# Patient Record
Sex: Female | Born: 1965 | Race: Black or African American | Hispanic: No | Marital: Single | Smoking: Former smoker
Health system: Southern US, Community
[De-identification: ages and names within clinical notes are randomized; demographics above are authoritative.]

## PROBLEM LIST (undated history)

## (undated) DIAGNOSIS — R519 Headache, unspecified: Secondary | ICD-10-CM

## (undated) DIAGNOSIS — F5104 Psychophysiologic insomnia: Secondary | ICD-10-CM

## (undated) DIAGNOSIS — J189 Pneumonia, unspecified organism: Secondary | ICD-10-CM

## (undated) DIAGNOSIS — K219 Gastro-esophageal reflux disease without esophagitis: Secondary | ICD-10-CM

## (undated) DIAGNOSIS — J45909 Unspecified asthma, uncomplicated: Secondary | ICD-10-CM

## (undated) DIAGNOSIS — G43019 Migraine without aura, intractable, without status migrainosus: Secondary | ICD-10-CM

## (undated) DIAGNOSIS — J383 Other diseases of vocal cords: Secondary | ICD-10-CM

## (undated) DIAGNOSIS — F419 Anxiety disorder, unspecified: Secondary | ICD-10-CM

## (undated) DIAGNOSIS — G4730 Sleep apnea, unspecified: Secondary | ICD-10-CM

## (undated) DIAGNOSIS — Z889 Allergy status to unspecified drugs, medicaments and biological substances status: Secondary | ICD-10-CM

## (undated) DIAGNOSIS — F32A Depression, unspecified: Secondary | ICD-10-CM

## (undated) DIAGNOSIS — I10 Essential (primary) hypertension: Secondary | ICD-10-CM

## (undated) DIAGNOSIS — C801 Malignant (primary) neoplasm, unspecified: Secondary | ICD-10-CM

## (undated) DIAGNOSIS — F329 Major depressive disorder, single episode, unspecified: Secondary | ICD-10-CM

## (undated) DIAGNOSIS — R51 Headache: Secondary | ICD-10-CM

## (undated) DIAGNOSIS — Z9189 Other specified personal risk factors, not elsewhere classified: Secondary | ICD-10-CM

## (undated) HISTORY — PX: PARTIAL HYSTERECTOMY: SHX80

## (undated) HISTORY — DX: Migraine without aura, intractable, without status migrainosus: G43.019

## (undated) HISTORY — PX: OTHER SURGICAL HISTORY: SHX169

## (undated) HISTORY — PX: UTERINE FIBROID SURGERY: SHX826

## (undated) HISTORY — DX: Psychophysiologic insomnia: F51.04

---

## 2015-04-24 ENCOUNTER — Emergency Department (HOSPITAL_COMMUNITY): Payer: Managed Care, Other (non HMO)

## 2015-04-24 ENCOUNTER — Encounter (HOSPITAL_COMMUNITY): Payer: Self-pay | Admitting: Emergency Medicine

## 2015-04-24 ENCOUNTER — Emergency Department (HOSPITAL_COMMUNITY)
Admission: EM | Admit: 2015-04-24 | Discharge: 2015-04-24 | Discharge status: Against Medical Advice | Payer: Managed Care, Other (non HMO) | Attending: Emergency Medicine | Admitting: Emergency Medicine

## 2015-04-24 DIAGNOSIS — I10 Essential (primary) hypertension: Secondary | ICD-10-CM | POA: Insufficient documentation

## 2015-04-24 DIAGNOSIS — R062 Wheezing: Secondary | ICD-10-CM

## 2015-04-24 DIAGNOSIS — Z87891 Personal history of nicotine dependence: Secondary | ICD-10-CM | POA: Insufficient documentation

## 2015-04-24 DIAGNOSIS — Z79899 Other long term (current) drug therapy: Secondary | ICD-10-CM | POA: Insufficient documentation

## 2015-04-24 DIAGNOSIS — J45901 Unspecified asthma with (acute) exacerbation: Secondary | ICD-10-CM | POA: Diagnosis not present

## 2015-04-24 DIAGNOSIS — Z8669 Personal history of other diseases of the nervous system and sense organs: Secondary | ICD-10-CM | POA: Insufficient documentation

## 2015-04-24 DIAGNOSIS — Z791 Long term (current) use of non-steroidal anti-inflammatories (NSAID): Secondary | ICD-10-CM | POA: Diagnosis not present

## 2015-04-24 DIAGNOSIS — R079 Chest pain, unspecified: Secondary | ICD-10-CM | POA: Diagnosis present

## 2015-04-24 HISTORY — DX: Sleep apnea, unspecified: G47.30

## 2015-04-24 HISTORY — DX: Unspecified asthma, uncomplicated: J45.909

## 2015-04-24 HISTORY — DX: Essential (primary) hypertension: I10

## 2015-04-24 MED ORDER — IPRATROPIUM-ALBUTEROL 0.5-2.5 (3) MG/3ML IN SOLN
3.0000 mL | Freq: Once | RESPIRATORY_TRACT | Status: AC
  Administered 2015-04-24: 3 mL via RESPIRATORY_TRACT
  Filled 2015-04-24: qty 3

## 2015-04-24 NOTE — ED Notes (Signed)
Pt has agreed to a breathing treatment, but is refusing all other treatment including labs and xrays.

## 2015-04-24 NOTE — ED Provider Notes (Signed)
CSN: [id number]     Arrival date & time 04/24/15  1435 History   First MD Initiated Contact with Patient 04/24/15 1510     Chief Complaint  Patient presents with  . Chest Pain  . Shortness of Breath     (Consider location/radiation/quality/duration/timing/severity/associated sxs/prior Treatment) Patient is a 49 y.o. female presenting with chest pain and shortness of breath.  Chest Pain Pain location:  L chest Pain quality: sharp   Pain radiates to:  Does not radiate Pain radiates to the back: no   Pain severity:  Severe Onset quality:  Gradual Duration:  2 weeks Timing:  Intermittent Progression:  Waxing and waning Chronicity:  Recurrent Context: breathing   Relieved by: albuterol. Worsened by:  Nothing tried Ineffective treatments:  None tried Associated symptoms: cough and shortness of breath   Associated symptoms: no abdominal pain, no diaphoresis, no fever, no headache, no nausea, no near-syncope and not vomiting   Risk factors: high cholesterol   Risk factors: no prior DVT/PE and no smoking   Shortness of Breath Associated symptoms: chest pain, cough and wheezing   Associated symptoms: no abdominal pain, no diaphoresis, no fever, no headaches, no rash, no sore throat and no vomiting     Past Medical History  Diagnosis Date  . Asthma   . Hypertension   . Sleep apnea    History reviewed. No pertinent past surgical history. History reviewed. No pertinent family history. Social History  Substance Use Topics  . Smoking status: Former Smoker -- 0.50 packs/day    Types: Cigarettes    Quit date: 08/24/1994  . Smokeless tobacco: None  . Alcohol Use: Yes     Comment: occasionally   OB History    No data available     Review of Systems  Constitutional: Negative for fever, chills and diaphoresis.  HENT: Negative for congestion and sore throat.   Eyes: Negative for visual disturbance.  Respiratory: Positive for cough, shortness of breath and wheezing.    Cardiovascular: Positive for chest pain. Negative for leg swelling and near-syncope.  Gastrointestinal: Negative for nausea, vomiting, abdominal pain, diarrhea and constipation.  Genitourinary: Negative for dysuria, difficulty urinating and vaginal pain.  Musculoskeletal: Negative for myalgias and arthralgias.  Skin: Negative for rash.  Neurological: Negative for syncope and headaches.  Psychiatric/Behavioral: Negative for behavioral problems.  All other systems reviewed and are negative.     Allergies  Prednisone  Home Medications   Prior to Admission medications   Medication Sig Start Date End Date Taking? Authorizing Provider  albuterol (PROAIR HFA) 108 (90 BASE) MCG/ACT inhaler Inhale 2 puffs into the lungs every 6 (six) hours as needed for wheezing or shortness of breath.   Yes Historical Provider, MD  albuterol (PROVENTIL) (2.5 MG/3ML) 0.083% nebulizer solution Take 2.5 mg by nebulization every 6 (six) hours as needed for wheezing or shortness of breath.   Yes Historical Provider, MD  ALPRAZolam (XANAX) 0.25 MG tablet Take 0.25 mg by mouth at bedtime.   Yes Historical Provider, MD  eszopiclone (LUNESTA) 1 MG TABS tablet Take 1 mg by mouth at bedtime. Take immediately before bedtime   Yes Historical Provider, MD  Fluticasone-Salmeterol (ADVAIR) 500-50 MCG/DOSE AEPB Inhale 1 puff into the lungs 2 (two) times daily.   Yes Historical Provider, MD  meloxicam (MOBIC) 15 MG tablet Take 15 mg by mouth daily.   Yes Historical Provider, MD  Omega-3 Fatty Acids (OMEGA 3 PO) Take 1 tablet by mouth daily.   Yes Historical Provider, MD  pantoprazole (PROTONIX) 40 MG tablet Take 40 mg by mouth daily.   Yes Historical Provider, MD  valsartan-hydrochlorothiazide (DIOVAN-HCT) 160-12.5 MG per tablet Take 1 tablet by mouth daily.   Yes Historical Provider, MD   BP 125/65 mmHg  Pulse 90  Temp(Src) 98.2 F (36.8 C) (Oral)  Resp 20  SpO2 95% Physical Exam  Constitutional: She is oriented to  person, place, and time. She appears well-developed and well-nourished. No distress.  HENT:  Head: Normocephalic and atraumatic.  Eyes: EOM are normal.  Neck: Normal range of motion.  Cardiovascular: Normal rate, regular rhythm and normal heart sounds.   No murmur heard. Pulmonary/Chest: Effort normal. No respiratory distress. She has wheezes. She has no rales. She exhibits no tenderness.  Abdominal: Soft. There is no tenderness.  Musculoskeletal: She exhibits no edema.  Neurological: She is alert and oriented to person, place, and time.  Skin: She is not diaphoretic.  Psychiatric: She has a normal mood and affect. Her behavior is normal.    ED Course  Procedures (including critical care time) Labs Review Labs Reviewed  [place], ED    Imaging Review Dg Chest Port 1 View  04/24/2015   CLINICAL DATA:  Asthma, hypertension, sleep apnea.  EXAM: PORTABLE CHEST - 1 VIEW  COMPARISON:  None.  FINDINGS: The heart size and mediastinal contours are within normal limits. Both lungs are clear. The visualized skeletal structures are unremarkable.  IMPRESSION: No active disease.   Electronically Signed   By: Nolon Nations M.D.   On: 04/24/2015 16:25   I have personally reviewed and evaluated these images and lab results as part of my medical decision-making.   EKG Interpretation   Date/Time:  Wednesday April 24 2015 14:39:56 EDT Ventricular Rate:  96 PR Interval:  164 QRS Duration: 86 QT Interval:  428 QTC Calculation: 541 R Axis:   -17 Text Interpretation:  Sinus rhythm Borderline left axis deviation  Borderline T abnormalities, anterior leads Prolonged QT interval No  previous ECGs available Confirmed by YAO  MD, DAVID ([id number]) on 04/24/2015  3:34:28 PM      MDM   Final diagnoses:  Chest pain, unspecified chest pain type  Wheezing     Is a 49 year old female with a history of asthma, hypertension, OSA, anxiety, laryngeal dysfunction that  presents with 2 weeks of intermittent chest pain and shortness of breath. Patient states this feels like her asthma however did not have her breathing treatments with her. Patient went to an urgent care prior to arrival and received an albuterol nebulized treatment and feels much better at this time. Upon arrival to the ED the patient is refusing all treatments with the exception of breathing treatment. Patient's EKG shows normal sinus rhythm with nonspecific T-wave abnormalities. Patient states no one has chest pain is short of breath and is wheezing. On exam patient is significant referred upper airway sounds however there is faint wheezing bilaterally. We will give her a DuoNeb treatment. Patient refuses chest x-ray blood work or steroids. The patient denies any fever or productive cough.  Patient feels initially better after breathing treatment chest x-ray was unremarkable. Again was discussed with the patient about risk for with bleeding prior to full cardiac workup and patient declined. Patient left AMA with return precautions.  Renne Musca, MD 04/24/15 [place] Yao, MD 04/24/15 [phone]

## 2015-04-24 NOTE — ED Notes (Signed)
Pt refusing to have IV/labs to be drawn, states she is terrified of needles. Explanation given as to why it's important for her to have this done because of her having chest pain. Pt asked for this RN to step out of room to have conversation with husband regarding this.

## 2015-04-24 NOTE — ED Notes (Signed)
Pt refusing lab draw at this time 

## 2015-04-24 NOTE — ED Notes (Signed)
MD at bedside. 

## 2015-04-24 NOTE — ED Notes (Signed)
Pt asking to leave AMA. Pt agrees to wait for MD to see pt and assess her before leaving.

## 2015-04-24 NOTE — ED Notes (Signed)
Attempted to get blood from pt pt refused RN aware

## 2015-04-24 NOTE — ED Notes (Signed)
Pt to ED via [facility] - pt has been having left sided lower chest pain x2 weeks, described as sharp; no radiation. Given 324 aspirin in route by EMS. Pt has hx of dysfunctional vocal chords. Pt is sob on arrival to ED. 118/74 BP, 177 CBG pt not diabetic, NSR on 12 lead, 100% on RA.

## 2015-04-24 NOTE — ED Notes (Signed)
Pt ambulatory at discharge with steady gait. A/O x4. Reports relief after breathing treatment. NAD on departure. VSS

## 2015-04-24 NOTE — Discharge Instructions (Signed)

## 2015-04-24 NOTE — ED Notes (Signed)
Pt reports significant relief with breathing treatment.

## 2015-04-30 ENCOUNTER — Emergency Department (HOSPITAL_COMMUNITY): Payer: Managed Care, Other (non HMO)

## 2015-04-30 ENCOUNTER — Inpatient Hospital Stay (HOSPITAL_COMMUNITY)
Admission: EM | Admit: 2015-04-30 | Discharge: 2015-05-08 | DRG: 193 | Disposition: A | Discharge status: Home Health | Payer: Managed Care, Other (non HMO) | Attending: Internal Medicine | Admitting: Internal Medicine

## 2015-04-30 ENCOUNTER — Encounter (HOSPITAL_COMMUNITY): Payer: Self-pay | Admitting: Emergency Medicine

## 2015-04-30 DIAGNOSIS — F419 Anxiety disorder, unspecified: Secondary | ICD-10-CM | POA: Diagnosis present

## 2015-04-30 DIAGNOSIS — R7309 Other abnormal glucose: Secondary | ICD-10-CM | POA: Diagnosis present

## 2015-04-30 DIAGNOSIS — K219 Gastro-esophageal reflux disease without esophagitis: Secondary | ICD-10-CM | POA: Diagnosis present

## 2015-04-30 DIAGNOSIS — J9600 Acute respiratory failure, unspecified whether with hypoxia or hypercapnia: Secondary | ICD-10-CM | POA: Diagnosis not present

## 2015-04-30 DIAGNOSIS — Z87891 Personal history of nicotine dependence: Secondary | ICD-10-CM

## 2015-04-30 DIAGNOSIS — Z79899 Other long term (current) drug therapy: Secondary | ICD-10-CM | POA: Diagnosis not present

## 2015-04-30 DIAGNOSIS — R140 Abdominal distension (gaseous): Secondary | ICD-10-CM

## 2015-04-30 DIAGNOSIS — R739 Hyperglycemia, unspecified: Secondary | ICD-10-CM | POA: Diagnosis present

## 2015-04-30 DIAGNOSIS — J45901 Unspecified asthma with (acute) exacerbation: Secondary | ICD-10-CM

## 2015-04-30 DIAGNOSIS — K5900 Constipation, unspecified: Secondary | ICD-10-CM | POA: Diagnosis present

## 2015-04-30 DIAGNOSIS — R7881 Bacteremia: Secondary | ICD-10-CM | POA: Diagnosis present

## 2015-04-30 DIAGNOSIS — Z888 Allergy status to other drugs, medicaments and biological substances status: Secondary | ICD-10-CM

## 2015-04-30 DIAGNOSIS — J069 Acute upper respiratory infection, unspecified: Secondary | ICD-10-CM | POA: Diagnosis present

## 2015-04-30 DIAGNOSIS — R0602 Shortness of breath: Secondary | ICD-10-CM | POA: Diagnosis not present

## 2015-04-30 DIAGNOSIS — J9601 Acute respiratory failure with hypoxia: Secondary | ICD-10-CM | POA: Diagnosis not present

## 2015-04-30 DIAGNOSIS — J4521 Mild intermittent asthma with (acute) exacerbation: Secondary | ICD-10-CM

## 2015-04-30 DIAGNOSIS — R7303 Prediabetes: Secondary | ICD-10-CM | POA: Diagnosis present

## 2015-04-30 DIAGNOSIS — J189 Pneumonia, unspecified organism: Principal | ICD-10-CM | POA: Diagnosis present

## 2015-04-30 DIAGNOSIS — Z791 Long term (current) use of non-steroidal anti-inflammatories (NSAID): Secondary | ICD-10-CM

## 2015-04-30 DIAGNOSIS — G4733 Obstructive sleep apnea (adult) (pediatric): Secondary | ICD-10-CM | POA: Diagnosis present

## 2015-04-30 DIAGNOSIS — Z8249 Family history of ischemic heart disease and other diseases of the circulatory system: Secondary | ICD-10-CM | POA: Diagnosis not present

## 2015-04-30 DIAGNOSIS — J383 Other diseases of vocal cords: Secondary | ICD-10-CM | POA: Diagnosis present

## 2015-04-30 DIAGNOSIS — R079 Chest pain, unspecified: Secondary | ICD-10-CM | POA: Diagnosis present

## 2015-04-30 DIAGNOSIS — B957 Other staphylococcus as the cause of diseases classified elsewhere: Secondary | ICD-10-CM | POA: Diagnosis present

## 2015-04-30 DIAGNOSIS — I10 Essential (primary) hypertension: Secondary | ICD-10-CM | POA: Diagnosis present

## 2015-04-30 DIAGNOSIS — R42 Dizziness and giddiness: Secondary | ICD-10-CM | POA: Diagnosis not present

## 2015-04-30 DIAGNOSIS — Z801 Family history of malignant neoplasm of trachea, bronchus and lung: Secondary | ICD-10-CM | POA: Diagnosis not present

## 2015-04-30 DIAGNOSIS — G4730 Sleep apnea, unspecified: Secondary | ICD-10-CM | POA: Diagnosis present

## 2015-04-30 HISTORY — DX: Anxiety disorder, unspecified: F41.9

## 2015-04-30 HISTORY — DX: Other diseases of vocal cords: J38.3

## 2015-04-30 LAB — CBC WITH DIFFERENTIAL/PLATELET
Basophils Absolute: 0 10*3/uL (ref 0.0–0.1)
Basophils Relative: 0 % (ref 0–1)
EOS ABS: 0 10*3/uL (ref 0.0–0.7)
Eosinophils Relative: 0 % (ref 0–5)
HCT: 36.4 % (ref 36.0–46.0)
HEMOGLOBIN: 12 g/dL (ref 12.0–15.0)
LYMPHS ABS: 0.8 10*3/uL (ref 0.7–4.0)
LYMPHS PCT: 8 % — AB (ref 12–46)
MCH: 28.3 pg (ref 26.0–34.0)
MCHC: 33 g/dL (ref 30.0–36.0)
MCV: 85.8 fL (ref 78.0–100.0)
Monocytes Absolute: 0.1 10*3/uL (ref 0.1–1.0)
Monocytes Relative: 1 % — ABNORMAL LOW (ref 3–12)
NEUTROS ABS: 8.4 10*3/uL — AB (ref 1.7–7.7)
NEUTROS PCT: 91 % — AB (ref 43–77)
Platelets: 251 10*3/uL (ref 150–400)
RBC: 4.24 MIL/uL (ref 3.87–5.11)
RDW: 15.7 % — ABNORMAL HIGH (ref 11.5–15.5)
WBC: 9.4 10*3/uL (ref 4.0–10.5)

## 2015-04-30 LAB — BASIC METABOLIC PANEL
Anion gap: 12 (ref 5–15)
BUN: 13 mg/dL (ref 6–20)
CHLORIDE: 104 mmol/L (ref 101–111)
CO2: 22 mmol/L (ref 22–32)
Calcium: 9.6 mg/dL (ref 8.9–10.3)
Creatinine, Ser: 0.91 mg/dL (ref 0.44–1.00)
GFR calc Af Amer: >60 mL/min (ref >60)
GFR calc non Af Amer: >60 mL/min (ref >60)
Glucose, Bld: 249 mg/dL — ABNORMAL HIGH (ref 65–99)
POTASSIUM: 3.7 mmol/L (ref 3.5–5.1)
SODIUM: 138 mmol/L (ref 135–145)

## 2015-04-30 MED ORDER — IPRATROPIUM-ALBUTEROL 0.5-2.5 (3) MG/3ML IN SOLN
3.0000 mL | RESPIRATORY_TRACT | Status: AC
  Administered 2015-04-30 (×3): 3 mL via RESPIRATORY_TRACT
  Filled 2015-04-30: qty 3
  Filled 2015-04-30: qty 9

## 2015-04-30 MED ORDER — MAGNESIUM SULFATE 2 GM/50ML IV SOLN
2.0000 g | Freq: Once | INTRAVENOUS | Status: AC
  Administered 2015-04-30: 2 g via INTRAVENOUS
  Filled 2015-04-30: qty 50

## 2015-04-30 MED ORDER — DEXTROSE 5 % IV SOLN
1.0000 g | Freq: Once | INTRAVENOUS | Status: AC
  Administered 2015-04-30: 1 g via INTRAVENOUS
  Filled 2015-04-30: qty 10

## 2015-04-30 MED ORDER — ALBUTEROL (5 MG/ML) CONTINUOUS INHALATION SOLN
10.0000 mg/h | INHALATION_SOLUTION | RESPIRATORY_TRACT | Status: DC
  Administered 2015-04-30: 10 mg/h via RESPIRATORY_TRACT
  Filled 2015-04-30: qty 20

## 2015-04-30 MED ORDER — SODIUM CHLORIDE 0.9 % IV BOLUS (SEPSIS)
1000.0000 mL | Freq: Once | INTRAVENOUS | Status: AC
  Administered 2015-04-30: 1000 mL via INTRAVENOUS

## 2015-04-30 MED ORDER — DEXTROSE 5 % IV SOLN
500.0000 mg | Freq: Once | INTRAVENOUS | Status: AC
  Administered 2015-04-30: 500 mg via INTRAVENOUS
  Filled 2015-04-30: qty 500

## 2015-04-30 NOTE — H&P (Signed)
[facility] History and Physical  Allison Guerra YCX:[id number] DOB: [birth year] DOA: 04/30/2015  Referring physician: ED physician PCP: No primary care provider on file.  Specialists:   Chief Complaint: Shortness of breath, productive cough  HPI: Allison Guerra is a 49 y.o. female with PMH of hypertension, asthma, GERD, anxiety, OSA, vocal cord dysfunction, who presents with the shortness of breath and productive cough.  Patient reports that she started having worsening shortness of breath at about 3 PM today. She coughs up yellow colored sputum in the past several days. She had one episode of mild chest pain, which has resolved. Currently not any chest pain. She has subjective fever, but no chills. She has nausea, but no abdominal pain, diarrhea. She is constipated. She reports that she had 2 hour traveling by car to [facility]. No tenderness over calf areas.   In ED, patient was found to have WBC 9.4, blood sugar 249 on BMP, temperature normal, tachycardia, electrolytes okay. Chest x-ray showed minimal opacity over both costophrenic angles which appears most consistent with atelectasis, but pneumonia particularly on the left difficult to exclude.  Where does patient live?   At home    Can patient participate in ADLs?  Yes   Review of Systems:   General: has subjective fevers, no chills, no changes in body weight, has fatigue HEENT: no blurry vision, hearing changes or sore throat Pulm: has dyspnea, coughing, wheezing CV: no chest pain, palpitations Abd: has nausea, no vomiting, abdominal pain, diarrhea, has constipation GU: no dysuria, burning on urination, increased urinary frequency, hematuria  Ext: no leg edema Neuro: no unilateral weakness, numbness, or tingling, no vision change or hearing loss Skin: no rash MSK: No muscle spasm, no deformity, no limitation of range of movement in spin Heme: No easy bruising.  Travel history: No recent long distant  travel.  Allergy:  Allergies  Allergen Reactions  . Reglan [Metoclopramide] Other (See Comments)    Made her aggressive, made her feel crazy  . Prednisone Anxiety    Jittery    Past Medical History  Diagnosis Date  . Asthma   . Hypertension   . Sleep apnea   . Vocal cord dysfunction   . Anxiety     Past Surgical History  Procedure Laterality Date  . Uterine fibroid surgery    . Partial hysterectomy      Social History:  reports that she quit smoking about 20 years ago. Her smoking use included Cigarettes. She smoked 0.50 packs per day. She does not have any smokeless tobacco history on file. She reports that she drinks alcohol. Her drug history is not on file.  Family History:  Family History  Problem Relation Age of Onset  . Cancer - Lung Mother   . Hypertension Father   . Hypertension Sister      Prior to Admission medications   Medication Sig Start Date End Date Taking? Authorizing Provider  albuterol (PROAIR HFA) 108 (90 BASE) MCG/ACT inhaler Inhale 2 puffs into the lungs every 6 (six) hours as needed for wheezing or shortness of breath.   Yes Historical Provider, MD  albuterol (PROVENTIL) (2.5 MG/3ML) 0.083% nebulizer solution Take 2.5 mg by nebulization every 6 (six) hours as needed for wheezing or shortness of breath.   Yes Historical Provider, MD  ALPRAZolam (XANAX) 0.25 MG tablet Take 0.25 mg by mouth 3 (three) times daily as needed for anxiety or sleep.    Yes Historical Provider, MD  eszopiclone (LUNESTA) 1 MG TABS tablet Take  1 mg by mouth at bedtime. Take immediately before bedtime   Yes Historical Provider, MD  fexofenadine (ALLEGRA) 180 MG tablet Take 180 mg by mouth daily as needed for allergies or rhinitis.   Yes Historical Provider, MD  Fluticasone-Salmeterol (ADVAIR) 500-50 MCG/DOSE AEPB Inhale 1 puff into the lungs 2 (two) times daily.   Yes Historical Provider, MD  ibuprofen (ADVIL,MOTRIN) 600 MG tablet Take 600 mg by mouth daily as needed for moderate  pain.   Yes Historical Provider, MD  meloxicam (MOBIC) 15 MG tablet Take 15 mg by mouth daily as needed.    Yes Historical Provider, MD  Omega-3 Fatty Acids (OMEGA 3 PO) Take 1 tablet by mouth daily.   Yes Historical Provider, MD  pantoprazole (PROTONIX) 40 MG tablet Take 40 mg by mouth daily.   Yes Historical Provider, MD  valsartan-hydrochlorothiazide (DIOVAN-HCT) 160-12.5 MG per tablet Take 1 tablet by mouth daily.   Yes Historical Provider, MD    Physical Exam: Filed Vitals:   04/30/15 1802 04/30/15 1817 04/30/15 1909 04/30/15 2057  BP:   129/65   Pulse:   103   Temp:      TempSrc:      Resp:   22   SpO2: 100% 100% 97% 98%   General: Not in acute distress HEENT:       Eyes: PERRL, EOMI, no scleral icterus.       ENT: No discharge from the ears and nose, no pharynx injection, no tonsillar enlargement.        Neck: No JVD, no bruit, no mass felt. Heme: No neck lymph node enlargement. Cardiac: S1/S2, RRR, No murmurs, No gallops or rubs. Pulm: severely decreased air movement bilaterally. Has wheezing. No rales,  rhonchi or rubs. Abd: Soft, nondistended, nontender, no rebound pain, no organomegaly, BS present. Ext: No pitting leg edema bilaterally. 2+DP/PT pulse bilaterally. Musculoskeletal: No joint deformities, No joint redness or warmth, no limitation of ROM in spin. Skin: No rashes.  Neuro: Alert, oriented X3, cranial nerves II-XII grossly intact, muscle strength 5/5 in all extremities, sensation to light touch intact.  Psych: Patient is not psychotic, no suicidal or hemocidal ideation.  Labs on Admission:  Basic Metabolic Panel:  Recent Labs Lab 04/30/15 2101  NA 138  K 3.7  CL 104  CO2 22  GLUCOSE 249*  BUN 13  CREATININE 0.91  CALCIUM 9.6   Liver Function Tests: No results for input(s): AST, ALT, ALKPHOS, BILITOT, PROT, ALBUMIN in the last 168 hours. No results for input(s): LIPASE, AMYLASE in the last 168 hours. No results for input(s): AMMONIA in the last 168  hours. CBC:  Recent Labs Lab 04/30/15 2101  WBC 9.4  NEUTROABS 8.4*  HGB 12.0  HCT 36.4  MCV 85.8  PLT 251   Cardiac Enzymes: No results for input(s): CKTOTAL, CKMB, CKMBINDEX, TROPONINI in the last 168 hours.  BNP (last 3 results) No results for input(s): BNP in the last 8760 hours.  ProBNP (last 3 results) No results for input(s): PROBNP in the last 8760 hours.  CBG: No results for input(s): GLUCAP in the last 168 hours.  Radiological Exams on Admission: Dg Chest Port 1 View  04/30/2015   CLINICAL DATA:  Productive cough, pain under both breasts since this morning with shortness of breath  EXAM: PORTABLE CHEST - 1 VIEW  COMPARISON:  None.  FINDINGS: Heart size and vascular pattern normal. Minimal density over both costophrenic angles. Lungs otherwise clear.  IMPRESSION: Minimal opacity over both costophrenic angles  which appears most consistent with atelectasis. Developing pneumonia particularly on the left difficult to exclude however.   Electronically Signed   By: Skipper Cliche M.D.   On: 04/30/2015 18:38    EKG: Not done in ED, will get one.   Assessment/Plan Principal Problem:   Acute respiratory failure Active Problems:   CAP (community acquired pneumonia)   Asthma exacerbation   Hypertension   Sleep apnea   SOB (shortness of breath)   Cough   Constipation   Elevated blood sugar   Anxiety  Acute respiratory failure: Most likely due to asthma exacerbation given decreased air movement and wheezing on lung auscultation, but cannot rule out CAP completely. Patient is not septic on admission.  -will admit patient to telemetry bed  -Nebulizers: scheduled Duoneb and prn albuterol -Solu-Medrol 60 mg IV tid -rocephin and azithromycin -Mucinex for cough  -Urine drug screen, HIV -Urine legionella and S. pneumococcal antigen -Follow up blood culture x2, sputum culture and Flu pcr  Hypertension: -continue  Diovan-HCTZ  OSA: -CPAP  GERD: -Protonix  Constipation: -Colace  Elevated blood sugar level: Blood sugar 249 on admission. Patient does not have history of diabetes. Expecting further increase of CBG due to starting Solu-Medrol. -check A1c -SSI  Anxiety: Stable, no suicidal or homicidal ideations. -Continue home medications: When necessary Xanax   DVT ppx: SQ Heparin    Code Status: Full code Family Communication: None at bed side.   Disposition Plan: Admit to inpatient   Date of Service 04/30/2015    Ivor Costa [facility] Pager [phone]  If 7PM-7AM, please contact night-coverage [email or web address] Password [email or web address] 04/30/2015, 11:38 PM

## 2015-04-30 NOTE — ED Provider Notes (Signed)
CSN: [id number]     Arrival date & time 04/30/15  1644 History   First MD Initiated Contact with Patient 04/30/15 1649     Chief Complaint  Patient presents with  . Shortness of Breath     (Consider location/radiation/quality/duration/timing/severity/associated sxs/prior Treatment) Patient is a 49 y.o. female presenting with shortness of breath. The history is provided by the patient.  Shortness of Breath Severity:  Moderate Onset quality:  Sudden Duration:  1 day Timing:  Constant Progression:  Worsening Chronicity:  Recurrent Context: URI   Relieved by:  Nothing Worsened by:  Nothing tried Ineffective treatments:  None tried Associated symptoms: sputum production and wheezing   Associated symptoms: no chest pain, no fever, no headaches and no vomiting    49 yo F with a chief complaint shortness breath. Patient has a history of asthma this feels the same. States she has had some cough congestion subjective fevers and chills with sputum production today. Was seen about a week ago for similar presentation. Patient feels like it has recurred. Say she's been compliant with her home medications.  Past Medical History  Diagnosis Date  . Asthma   . Hypertension   . Sleep apnea   . Vocal cord dysfunction   . Anxiety    Past Surgical History  Procedure Laterality Date  . Uterine fibroid surgery    . Partial hysterectomy     Family History  Problem Relation Age of Onset  . Cancer - Lung Mother   . Hypertension Father   . Hypertension Sister    Social History  Substance Use Topics  . Smoking status: Former Smoker -- 0.50 packs/day    Types: Cigarettes    Quit date: 08/24/1994  . Smokeless tobacco: None  . Alcohol Use: Yes     Comment: occasionally   OB History    No data available     Review of Systems  Constitutional: Negative for fever and chills.  HENT: Negative for congestion and rhinorrhea.   Eyes: Negative for redness and visual disturbance.  Respiratory:  Positive for sputum production, shortness of breath and wheezing.   Cardiovascular: Negative for chest pain and palpitations.  Gastrointestinal: Negative for nausea and vomiting.  Genitourinary: Negative for dysuria and urgency.  Musculoskeletal: Negative for myalgias and arthralgias.  Skin: Negative for pallor and wound.  Neurological: Negative for dizziness and headaches.      Allergies  Reglan and Prednisone  Home Medications   Prior to Admission medications   Medication Sig Start Date End Date Taking? Authorizing Provider  albuterol (PROAIR HFA) 108 (90 BASE) MCG/ACT inhaler Inhale 2 puffs into the lungs every 6 (six) hours as needed for wheezing or shortness of breath.   Yes Historical Provider, MD  albuterol (PROVENTIL) (2.5 MG/3ML) 0.083% nebulizer solution Take 2.5 mg by nebulization every 6 (six) hours as needed for wheezing or shortness of breath.   Yes Historical Provider, MD  ALPRAZolam (XANAX) 0.25 MG tablet Take 0.25 mg by mouth 3 (three) times daily as needed for anxiety or sleep.    Yes Historical Provider, MD  eszopiclone (LUNESTA) 1 MG TABS tablet Take 1 mg by mouth at bedtime. Take immediately before bedtime   Yes Historical Provider, MD  fexofenadine (ALLEGRA) 180 MG tablet Take 180 mg by mouth daily as needed for allergies or rhinitis.   Yes Historical Provider, MD  Fluticasone-Salmeterol (ADVAIR) 500-50 MCG/DOSE AEPB Inhale 1 puff into the lungs 2 (two) times daily.   Yes Historical Provider, MD  ibuprofen (  ADVIL,MOTRIN) 600 MG tablet Take 600 mg by mouth daily as needed for moderate pain.   Yes Historical Provider, MD  meloxicam (MOBIC) 15 MG tablet Take 15 mg by mouth daily as needed.    Yes Historical Provider, MD  Omega-3 Fatty Acids (OMEGA 3 PO) Take 1 tablet by mouth daily.   Yes Historical Provider, MD  pantoprazole (PROTONIX) 40 MG tablet Take 40 mg by mouth daily.   Yes Historical Provider, MD  valsartan-hydrochlorothiazide (DIOVAN-HCT) 160-12.5 MG per  tablet Take 1 tablet by mouth daily.   Yes Historical Provider, MD   BP 129/65 mmHg  Pulse 103  Temp(Src) 97.4 F (36.3 C) (Oral)  Resp 22  SpO2 98% Physical Exam  Constitutional: She is oriented to person, place, and time. She appears well-developed and well-nourished. No distress.  HENT:  Head: Normocephalic and atraumatic.  Eyes: EOM are normal. Pupils are equal, round, and reactive to light.  Neck: Normal range of motion. Neck supple.  Cardiovascular: Normal rate and regular rhythm.  Exam reveals no gallop and no friction rub.   No murmur heard. Pulmonary/Chest: Effort normal. She has wheezes (diffuse prolonged expiration). She has no rales.  Abdominal: Soft. She exhibits no distension. There is no tenderness.  Musculoskeletal: She exhibits no edema or tenderness.  Neurological: She is alert and oriented to person, place, and time.  Skin: Skin is warm and dry. She is not diaphoretic.  Psychiatric: She has a normal mood and affect. Her behavior is normal.    ED Course  Procedures (including critical care time) Labs Review Labs Reviewed  CBC WITH DIFFERENTIAL/PLATELET - Abnormal; Notable for the following:    RDW 15.7 (*)    Neutrophils Relative % 91 (*)    Neutro Abs 8.4 (*)    Lymphocytes Relative 8 (*)    Monocytes Relative 1 (*)    All other components within normal limits  BASIC METABOLIC PANEL - Abnormal; Notable for the following:    Glucose, Bld 249 (*)    All other components within normal limits    Imaging Review Dg Chest Port 1 View  04/30/2015   CLINICAL DATA:  Productive cough, pain under both breasts since this morning with shortness of breath  EXAM: PORTABLE CHEST - 1 VIEW  COMPARISON:  None.  FINDINGS: Heart size and vascular pattern normal. Minimal density over both costophrenic angles. Lungs otherwise clear.  IMPRESSION: Minimal opacity over both costophrenic angles which appears most consistent with atelectasis. Developing pneumonia particularly on the  left difficult to exclude however.   Electronically Signed   By: Skipper Cliche M.D.   On: 04/30/2015 18:38   I have personally reviewed and evaluated these images and lab results as part of my medical decision-making.   EKG Interpretation None      MDM   Final diagnoses:  Asthma, mild intermittent, with acute exacerbation  Essential hypertension  Sleep apnea  Vocal cord dysfunction  Anxiety    49 yo F with a chief complaint of asthma exacerbation. Will treat with 3 DuoNeb's back-to-back. Patient was given 125 of Solu-Medrol en route. Obtain chest x-rays injury to possible infectious symptoms. Patient states the patient is really hungry and wants to eat. Patient able to eat without difficulty.  Patient continuing have difficulty breathing after doing nebs. Placed on continuous albuterol. Chest x-ray concerning for possible infection. Will treat for community acquired pneumonia.  Patient reassessed after continuous continuing to be tachypnea continued low 20s. Diffuse wheezes. Will admit. The patients results and  plan were reviewed and discussed.   Any x-rays performed were independently reviewed by myself.   Differential diagnosis were considered with the presenting HPI.  Medications  albuterol (PROVENTIL,VENTOLIN) solution continuous neb (0 mg/hr Nebulization Stopped 04/30/15 2301)  ipratropium-albuterol (DUONEB) 0.5-2.5 (3) MG/3ML nebulizer solution 3 mL (3 mLs Nebulization Given 04/30/15 1817)  cefTRIAXone (ROCEPHIN) 1 g in dextrose 5 % 50 mL IVPB (0 g Intravenous Stopped 04/30/15 2142)  azithromycin (ZITHROMAX) 500 mg in dextrose 5 % 250 mL IVPB (0 mg Intravenous Stopped 04/30/15 2255)  sodium chloride 0.9 % bolus 1,000 mL (0 mLs Intravenous Stopped 04/30/15 2325)  magnesium sulfate IVPB 2 g 50 mL (0 g Intravenous Stopped 04/30/15 2248)    Filed Vitals:   04/30/15 1802 04/30/15 1817 04/30/15 1909 04/30/15 2057  BP:   129/65   Pulse:   103   Temp:      TempSrc:      Resp:   22    SpO2: 100% 100% 97% 98%    Final diagnoses:  Asthma, mild intermittent, with acute exacerbation  Essential hypertension  Sleep apnea  Vocal cord dysfunction  Anxiety    Admission/ observation were discussed with the admitting physician, patient and/or family and they are comfortable with the plan.    Deno Etienne, DO 05/01/15 0000

## 2015-04-30 NOTE — Progress Notes (Signed)
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  Patient reports her pcp is in [place] Dr. Laney Pastor.  Patient reports she travels back and forth to [place] to see her doctor.  EDCM attempted to print patient a list of pcps but was unable to due to her insurance is from [place] instructed patient to call the phone number on the back of her insurance card to assist her in finding a physician in [place] and if insurance needs to transfer to [employer].  Patient verbalized understanding.  No further EDCM needs at this time.

## 2015-04-30 NOTE — ED Notes (Signed)
Per [facility], pt was picked from [facility], co sob and wheezing, and productive cough. Hx asthma . Pt received personal inhaler, also received 0.5 atrovent and 5 mg albuterol and 125 mg Solumedrol IV . Pt is alert and  oriented x 4.

## 2015-05-01 ENCOUNTER — Encounter (HOSPITAL_COMMUNITY): Payer: Self-pay

## 2015-05-01 DIAGNOSIS — I10 Essential (primary) hypertension: Secondary | ICD-10-CM

## 2015-05-01 DIAGNOSIS — F419 Anxiety disorder, unspecified: Secondary | ICD-10-CM

## 2015-05-01 DIAGNOSIS — K5900 Constipation, unspecified: Secondary | ICD-10-CM

## 2015-05-01 DIAGNOSIS — R7309 Other abnormal glucose: Secondary | ICD-10-CM

## 2015-05-01 DIAGNOSIS — R05 Cough: Secondary | ICD-10-CM

## 2015-05-01 DIAGNOSIS — R0602 Shortness of breath: Secondary | ICD-10-CM

## 2015-05-01 LAB — GLUCOSE, CAPILLARY
Glucose-Capillary: 305 mg/dL — ABNORMAL HIGH (ref 65–99)
Glucose-Capillary: 327 mg/dL — ABNORMAL HIGH (ref 65–99)
Glucose-Capillary: 381 mg/dL — ABNORMAL HIGH (ref 65–99)

## 2015-05-01 LAB — RAPID URINE DRUG SCREEN, HOSP PERFORMED
AMPHETAMINES: NOT DETECTED
BENZODIAZEPINES: POSITIVE — AB
Barbiturates: NOT DETECTED
Cocaine: NOT DETECTED
Opiates: NOT DETECTED
TETRAHYDROCANNABINOL: NOT DETECTED

## 2015-05-01 LAB — CBG MONITORING, ED: GLUCOSE-CAPILLARY: 309 mg/dL — AB (ref 65–99)

## 2015-05-01 LAB — STREP PNEUMONIAE URINARY ANTIGEN: STREP PNEUMO URINARY ANTIGEN: NEGATIVE

## 2015-05-01 LAB — HIV ANTIBODY (ROUTINE TESTING W REFLEX): HIV Screen 4th Generation wRfx: NONREACTIVE

## 2015-05-01 MED ORDER — LORATADINE 10 MG PO TABS
10.0000 mg | ORAL_TABLET | Freq: Every day | ORAL | Status: DC
  Administered 2015-05-01 – 2015-05-08 (×8): 10 mg via ORAL
  Filled 2015-05-01 (×8): qty 1

## 2015-05-01 MED ORDER — DOCUSATE SODIUM 100 MG PO CAPS
100.0000 mg | ORAL_CAPSULE | Freq: Two times a day (BID) | ORAL | Status: DC
  Administered 2015-05-01 – 2015-05-07 (×14): 100 mg via ORAL
  Filled 2015-05-01 (×14): qty 1

## 2015-05-01 MED ORDER — METHYLPREDNISOLONE SODIUM SUCC 125 MG IJ SOLR
60.0000 mg | Freq: Three times a day (TID) | INTRAMUSCULAR | Status: DC
  Administered 2015-05-01 – 2015-05-02 (×3): 60 mg via INTRAVENOUS
  Filled 2015-05-01 (×3): qty 2

## 2015-05-01 MED ORDER — HEPARIN SODIUM (PORCINE) 5000 UNIT/ML IJ SOLN
5000.0000 [IU] | Freq: Three times a day (TID) | INTRAMUSCULAR | Status: DC
Start: 2015-05-01 — End: 2015-05-01
  Administered 2015-05-01 (×2): 5000 [IU] via SUBCUTANEOUS
  Filled 2015-05-01 (×6): qty 1

## 2015-05-01 MED ORDER — DEXTROSE 5 % IV SOLN
1.0000 g | INTRAVENOUS | Status: DC
  Administered 2015-05-01 – 2015-05-06 (×6): 1 g via INTRAVENOUS
  Filled 2015-05-01 (×7): qty 10

## 2015-05-01 MED ORDER — PANTOPRAZOLE SODIUM 40 MG PO TBEC
40.0000 mg | DELAYED_RELEASE_TABLET | Freq: Every day | ORAL | Status: DC
  Administered 2015-05-01 – 2015-05-07 (×7): 40 mg via ORAL
  Filled 2015-05-01 (×8): qty 1

## 2015-05-01 MED ORDER — INSULIN GLARGINE 100 UNIT/ML ~~LOC~~ SOLN
10.0000 [IU] | Freq: Every day | SUBCUTANEOUS | Status: DC
  Administered 2015-05-01 – 2015-05-06 (×6): 10 [IU] via SUBCUTANEOUS
  Filled 2015-05-01 (×6): qty 0.1

## 2015-05-01 MED ORDER — IPRATROPIUM-ALBUTEROL 0.5-2.5 (3) MG/3ML IN SOLN
3.0000 mL | RESPIRATORY_TRACT | Status: DC
  Administered 2015-05-01: 3 mL via RESPIRATORY_TRACT
  Filled 2015-05-01: qty 3

## 2015-05-01 MED ORDER — IPRATROPIUM-ALBUTEROL 0.5-2.5 (3) MG/3ML IN SOLN
3.0000 mL | RESPIRATORY_TRACT | Status: DC
  Administered 2015-05-01 – 2015-05-02 (×5): 3 mL via RESPIRATORY_TRACT
  Filled 2015-05-01 (×6): qty 3

## 2015-05-01 MED ORDER — DEXTROSE 5 % IV SOLN
500.0000 mg | INTRAVENOUS | Status: DC
  Administered 2015-05-01: 500 mg via INTRAVENOUS
  Filled 2015-05-01: qty 500

## 2015-05-01 MED ORDER — ALPRAZOLAM 0.25 MG PO TABS
0.2500 mg | ORAL_TABLET | Freq: Three times a day (TID) | ORAL | Status: DC | PRN
  Administered 2015-05-01 (×2): 0.25 mg via ORAL
  Filled 2015-05-01 (×2): qty 1

## 2015-05-01 MED ORDER — ALBUTEROL SULFATE (2.5 MG/3ML) 0.083% IN NEBU
2.5000 mg | INHALATION_SOLUTION | RESPIRATORY_TRACT | Status: DC | PRN
  Administered 2015-05-04 – 2015-05-05 (×2): 2.5 mg via RESPIRATORY_TRACT
  Filled 2015-05-01 (×2): qty 3

## 2015-05-01 MED ORDER — HYDROCHLOROTHIAZIDE 12.5 MG PO CAPS
12.5000 mg | ORAL_CAPSULE | Freq: Every day | ORAL | Status: DC
  Administered 2015-05-01 – 2015-05-08 (×8): 12.5 mg via ORAL
  Filled 2015-05-01 (×8): qty 1

## 2015-05-01 MED ORDER — METHYLPREDNISOLONE SODIUM SUCC 40 MG IJ SOLR
40.0000 mg | Freq: Four times a day (QID) | INTRAMUSCULAR | Status: DC
  Administered 2015-05-01: 40 mg via INTRAVENOUS
  Filled 2015-05-01: qty 1

## 2015-05-01 MED ORDER — VALSARTAN-HYDROCHLOROTHIAZIDE 160-12.5 MG PO TABS: 1.0000 | ORAL_TABLET | Freq: Every day | ORAL | Status: DC

## 2015-05-01 MED ORDER — DM-GUAIFENESIN ER 30-600 MG PO TB12
1.0000 | ORAL_TABLET | Freq: Two times a day (BID) | ORAL | Status: DC
  Administered 2015-05-01 – 2015-05-08 (×15): 1 via ORAL
  Filled 2015-05-01 (×18): qty 1

## 2015-05-01 MED ORDER — ONDANSETRON HCL 4 MG/2ML IJ SOLN: 4.0000 mg | Freq: Three times a day (TID) | INTRAMUSCULAR | Status: DC | PRN

## 2015-05-01 MED ORDER — IRBESARTAN 150 MG PO TABS
300.0000 mg | ORAL_TABLET | Freq: Every day | ORAL | Status: DC
  Administered 2015-05-01 – 2015-05-08 (×8): 300 mg via ORAL
  Filled 2015-05-01: qty 1
  Filled 2015-05-01 (×5): qty 2
  Filled 2015-05-01: qty 1
  Filled 2015-05-01 (×3): qty 2

## 2015-05-01 MED ORDER — ALPRAZOLAM 0.5 MG PO TABS
0.5000 mg | ORAL_TABLET | Freq: Three times a day (TID) | ORAL | Status: DC | PRN
  Administered 2015-05-01 – 2015-05-08 (×19): 0.5 mg via ORAL
  Filled 2015-05-01 (×20): qty 1

## 2015-05-01 MED ORDER — INSULIN ASPART 100 UNIT/ML ~~LOC~~ SOLN
0.0000 [IU] | Freq: Three times a day (TID) | SUBCUTANEOUS | Status: DC
  Administered 2015-05-01 (×3): 7 [IU] via SUBCUTANEOUS
  Filled 2015-05-01: qty 1

## 2015-05-01 MED ORDER — ENOXAPARIN SODIUM 40 MG/0.4ML ~~LOC~~ SOLN
40.0000 mg | SUBCUTANEOUS | Status: DC
  Administered 2015-05-01 – 2015-05-07 (×7): 40 mg via SUBCUTANEOUS
  Filled 2015-05-01 (×9): qty 0.4

## 2015-05-01 MED ORDER — ZOLPIDEM TARTRATE 5 MG PO TABS: 5.0000 mg | ORAL_TABLET | Freq: Every evening | ORAL | Status: DC | PRN

## 2015-05-01 MED ORDER — IBUPROFEN 200 MG PO TABS
600.0000 mg | ORAL_TABLET | Freq: Every day | ORAL | Status: DC | PRN
  Administered 2015-05-01 – 2015-05-04 (×3): 600 mg via ORAL
  Filled 2015-05-01 (×3): qty 3

## 2015-05-01 MED ORDER — ZOLPIDEM TARTRATE 5 MG PO TABS
5.0000 mg | ORAL_TABLET | Freq: Every evening | ORAL | Status: DC | PRN
  Administered 2015-05-01 – 2015-05-08 (×7): 5 mg via ORAL
  Filled 2015-05-01 (×7): qty 1

## 2015-05-01 MED ORDER — OMEGA-3-ACID ETHYL ESTERS 1 G PO CAPS
1.0000 | ORAL_CAPSULE | Freq: Every day | ORAL | Status: DC
  Administered 2015-05-05 – 2015-05-07 (×2): 1 g via ORAL
  Filled 2015-05-01 (×8): qty 1

## 2015-05-01 NOTE — Progress Notes (Signed)
Pt confirms with Cm she ha recently moved to [place] from [place] her pcp presently is Dr Donata Clay, Internist  [address], [place] [address] [phone]  [facility] ED CM noted pt with coverage but no pcp listed Spoke with pt who confirms no pcp but aware of how to obtain one with insurance coverage customer service number or web site   Cm reviewed ED level of care for crisis/emergent services and community pcp level of care to manage continuous or chronic medical concerns.  The pt voiced understanding CM encouraged pt and discussed pt's responsibility to verify with pt's insurance carrier that any recommended medical provider offered by any emergency room or a hospital provider is within the carrier's network. The pt voiced understanding

## 2015-05-01 NOTE — Progress Notes (Addendum)
Progress Note   Allison Guerra YYT:[id number] DOB: 1966-08-21 DOA: May 09, 2015 PCP: PROVIDER NOT IN SYSTEM Dr Donata Clay, Internist [address], [place] [address] [phone]    Brief Narrative:   Allison Guerra is an 49 y.o. female with a PMH of hypertension, asthma, GERD, anxiety, OSA and vocal cord dysfunction was admitted May 09, 2015 acute onset of shortness of breath accompanied by cough and per sputum. Chest x-ray showed minimal opacity over both costophrenic angles consistent with atelectasis, but pneumonia could not be excluded.  Assessment/Plan:   Principal Problem:   Acute respiratory failure secondary to asthma exacerbation rule out community-acquired pneumonia/cough/shortness of breath - Continue steroids, nebulized broncho-dilator therapy, Mucinex and empiric antibiotics with Rocephin/azithromycin. - Solumedrol 40 mg IV Q 6 hours started. - Follow-up HIV antibody, strep and Legionella antigens, and influenza panel.  Active Problems:   Hypertension - Continue Diovan/HCTZ.    Sleep apnea - CPAP Q HS.    Constipation - Colace BID ordered.    Elevated blood sugar - Likely steroid-induced, but hemoglobin A1c checked and is pending at this time. - Continue insulin sensitive SSI.    Anxiety - Xanax ordered.    DVT Prophylaxis - Lovenox ordered.  Family Communication: No family at bedside. Disposition Plan: Home when respiratory status improved, likely 2 days. Code Status:     Code Status Orders        Start     Ordered   05/01/15 0314  Full code   Continuous     05/01/15 0313        IV Access:    Peripheral IV   Procedures and diagnostic studies:   Dg Chest Port 1 View  2015-05-09   CLINICAL DATA:  Productive cough, pain under both breasts since this morning with shortness of breath  EXAM: PORTABLE CHEST - 1 VIEW  COMPARISON:  None.  FINDINGS: Heart size and vascular pattern normal. Minimal density over both costophrenic  angles. Lungs otherwise clear.  IMPRESSION: Minimal opacity over both costophrenic angles which appears most consistent with atelectasis. Developing pneumonia particularly on the left difficult to exclude however.   Electronically Signed   By: Skipper Cliche M.D.   On: May 09, 2015 18:38     Medical Consultants:    None.  Anti-Infectives:    Rocephin May 09, 2015--->  Azithromycin 2015-05-09--->  Subjective:   Allison Guerra is short of breath with a cough productive of yellow sputum.  No chest pain.  Objective:    Filed Vitals:   05/01/15 0700 05/01/15 0945 05/01/15 1230 05/01/15 1239  BP: 108/57 118/43 142/72   Pulse: 99 100 95   Temp:   98.4 F (36.9 C)   TempSrc:   Oral   Resp: 23 18 20    SpO2: 95% 94% 98% 98%   No intake or output data in the 24 hours ending 05/01/15 1300 There were no vitals filed for this visit.  Exam: Gen:  NAD Cardiovascular:  Tachy, No M/R/G Respiratory:  Lungs diminished with expiratory wheezes Gastrointestinal:  Abdomen soft, NT/ND, + BS Extremities:  No C/E/C   Data Reviewed:    Labs: Basic Metabolic Panel:  Recent Labs Lab 2015-05-09 2101  NA 138  K 3.7  CL 104  CO2 22  GLUCOSE 249*  BUN 13  CREATININE 0.91  CALCIUM 9.6   GFR CrCl cannot be calculated (Unknown ideal weight.).  CBC:  Recent Labs Lab 2015/05/09 2101  WBC 9.4  NEUTROABS 8.4*  HGB 12.0  HCT 36.4  MCV 85.8  PLT 251   CBG:  Recent Labs Lab 05/01/15 0750  GLUCAP 309*   Microbiology No results found for this or any previous visit (from the past 240 hour(s)).   Medications:   . azithromycin  500 mg Intravenous Q24H  . cefTRIAXone (ROCEPHIN)  IV  1 g Intravenous Q24H  . dextromethorphan-guaiFENesin  1 tablet Oral BID  . docusate sodium  100 mg Oral BID  . heparin  5,000 Units Subcutaneous 3 times per day  . hydrochlorothiazide  12.5 mg Oral Daily  . insulin aspart  0-9 Units Subcutaneous TID WC  . ipratropium-albuterol  3 mL Nebulization Q4H  .  irbesartan  300 mg Oral Daily  . loratadine  10 mg Oral Daily  . methylPREDNISolone (SOLU-MEDROL) injection  60 mg Intravenous 3 times per day  . omega-3 acid ethyl esters  1 capsule Oral Daily  . pantoprazole  40 mg Oral Daily   Continuous Infusions:    Time spent: 25 minutes.   LOS: 1 day   RAMA,CHRISTINA  [facility] Pager [phone]. If unable to reach me by pager, please call my cell phone at [phone].  *Please refer to [email or web address], password [email or web address] to get updated schedule on who will round on this patient, as hospitalists switch teams weekly. If 7PM-7AM, please contact night-coverage at [email or web address], password [email or web address] for any overnight needs.  05/01/2015, 1:00 PM

## 2015-05-01 NOTE — ED Notes (Signed)
Placed pt on 2 l [place]. Due to SOB, no distress. More exacerbated with movement.

## 2015-05-01 NOTE — Progress Notes (Signed)
Inpatient Diabetes Program Recommendations  AACE/ADA: New Consensus Statement on Inpatient Glycemic Control (2013)  Target Ranges:  Prepandial:   less than 140 mg/dL      Peak postprandial:   less than 180 mg/dL (1-2 hours)      Critically ill patients:  140 - 180 mg/dL   Review of Glycemic Control:  Results for Allison Guerra, Allison Guerra (MRN [id number]) as of 05/01/2015 14:15  Ref. Range 05/01/2015 07:50 05/01/2015 13:45  Glucose-Capillary Latest Ref Range: 65-99 mg/dL 309 (H) 305 (H)   Diabetes history:  None Outpatient Diabetes medications: None Current orders for Inpatient glycemic control:  Novolog sensitive tid with meals--Note that patient is also on Solu-medrol 60 mg IV three times a day  A1C pending.  May consider adding basal insulin as indicated. Will follow.  Thanks, Adah Perl, RN, BC-ADM Inpatient Diabetes Coordinator Pager [phone] (8a-5p)

## 2015-05-01 NOTE — ED Notes (Signed)
Called pharmacy for scheduled meds, spoke with Leda Gauze.

## 2015-05-01 NOTE — Progress Notes (Signed)
Spoke with the patient about using a cpap machine for tonight. She does not want a machine set up for her. She does not wear it at home and cannot tolerate it. Advised patient to call if she changes her mind.

## 2015-05-01 NOTE — ED Notes (Signed)
Pt stated "I called out and no one came.  I want to speak to the head nurse.  Are you all short staffed or something?"  Pt had vomited mucus.  Pt linens & gown changed; her request for warm blanket & water were also accommodated.  Pt also stated "I've taken pictures of this."  Charge nurse notified.

## 2015-05-01 NOTE — ED Notes (Signed)
Per RN - wait to get 2nd set of cultures.

## 2015-05-01 NOTE — ED Notes (Signed)
Pt can go at 11:40 to floor.

## 2015-05-02 DIAGNOSIS — R7881 Bacteremia: Secondary | ICD-10-CM

## 2015-05-02 DIAGNOSIS — R7303 Prediabetes: Secondary | ICD-10-CM | POA: Diagnosis present

## 2015-05-02 LAB — EXPECTORATED SPUTUM ASSESSMENT W GRAM STAIN, RFLX TO RESP C: Special Requests: NORMAL

## 2015-05-02 LAB — GLUCOSE, CAPILLARY
GLUCOSE-CAPILLARY: 318 mg/dL — AB (ref 65–99)
GLUCOSE-CAPILLARY: 308 mg/dL — AB (ref 65–99)
GLUCOSE-CAPILLARY: 341 mg/dL — AB (ref 65–99)
Glucose-Capillary: 246 mg/dL — ABNORMAL HIGH (ref 65–99)

## 2015-05-02 LAB — INFLUENZA PANEL BY PCR (TYPE A & B)
H1N1 flu by pcr: NOT DETECTED
INFLAPCR: NEGATIVE
INFLBPCR: NEGATIVE

## 2015-05-02 LAB — EXPECTORATED SPUTUM ASSESSMENT W REFEX TO RESP CULTURE

## 2015-05-02 LAB — LEGIONELLA ANTIGEN, URINE

## 2015-05-02 LAB — HEMOGLOBIN A1C
HEMOGLOBIN A1C: 6.3 % — AB (ref 4.8–5.6)
MEAN PLASMA GLUCOSE: 134 mg/dL

## 2015-05-02 MED ORDER — INSULIN ASPART 100 UNIT/ML ~~LOC~~ SOLN
0.0000 [IU] | Freq: Three times a day (TID) | SUBCUTANEOUS | Status: DC
  Administered 2015-05-02: 7 [IU] via SUBCUTANEOUS
  Administered 2015-05-02 (×2): 15 [IU] via SUBCUTANEOUS
  Administered 2015-05-03 (×2): 11 [IU] via SUBCUTANEOUS
  Administered 2015-05-03: 7 [IU] via SUBCUTANEOUS
  Administered 2015-05-04: 4 [IU] via SUBCUTANEOUS
  Administered 2015-05-04: 15 [IU] via SUBCUTANEOUS
  Administered 2015-05-04 – 2015-05-05 (×3): 7 [IU] via SUBCUTANEOUS

## 2015-05-02 MED ORDER — INSULIN ASPART 100 UNIT/ML ~~LOC~~ SOLN
0.0000 [IU] | Freq: Every day | SUBCUTANEOUS | Status: DC
  Administered 2015-05-02: 4 [IU] via SUBCUTANEOUS
  Administered 2015-05-03 – 2015-05-04 (×2): 2 [IU] via SUBCUTANEOUS

## 2015-05-02 MED ORDER — INSULIN ASPART 100 UNIT/ML ~~LOC~~ SOLN
6.0000 [IU] | Freq: Three times a day (TID) | SUBCUTANEOUS | Status: DC
  Administered 2015-05-02 – 2015-05-06 (×13): 6 [IU] via SUBCUTANEOUS

## 2015-05-02 MED ORDER — AZITHROMYCIN 250 MG PO TABS
500.0000 mg | ORAL_TABLET | Freq: Every day | ORAL | Status: DC
  Administered 2015-05-02 – 2015-05-06 (×5): 500 mg via ORAL
  Filled 2015-05-02 (×5): qty 2

## 2015-05-02 MED ORDER — IPRATROPIUM-ALBUTEROL 0.5-2.5 (3) MG/3ML IN SOLN
3.0000 mL | Freq: Four times a day (QID) | RESPIRATORY_TRACT | Status: DC
  Administered 2015-05-02 – 2015-05-07 (×20): 3 mL via RESPIRATORY_TRACT
  Filled 2015-05-02 (×19): qty 3

## 2015-05-02 MED ORDER — METHYLPREDNISOLONE SODIUM SUCC 125 MG IJ SOLR
60.0000 mg | Freq: Two times a day (BID) | INTRAMUSCULAR | Status: AC
  Administered 2015-05-02 – 2015-05-03 (×3): 60 mg via INTRAVENOUS
  Filled 2015-05-02 (×3): qty 2

## 2015-05-02 MED ORDER — GI COCKTAIL ~~LOC~~
30.0000 mL | Freq: Once | ORAL | Status: AC
  Administered 2015-05-02: 30 mL via ORAL
  Filled 2015-05-02: qty 30

## 2015-05-02 NOTE — Clinical Documentation Improvement (Signed)
Internal Medicine " Abnormal Lab/Test Results:  "GRAM POSITIVE COCCI IN CLUSTERS AEROBIC"   from culture bottle after 1 day of growth time  Possible Clinical Conditions associated with below indicators  Bacteremia  Contamination  Other Condition  Cannot Clinically Determine    Treatment Provided: Patient currently on Rocephin /Azithromycin    Please exercise your independent, professional judgment when responding. A specific answer is not anticipated or expected.   Thank You,  [place] [phone]

## 2015-05-02 NOTE — Progress Notes (Signed)
Follow up appointment for pt is on 9/20 at [facility] with [facility]. Pt was given this information.

## 2015-05-02 NOTE — Progress Notes (Signed)
PHARMACIST - PHYSICIAN COMMUNICATION DR:   Rama CONCERNING: Antibiotic IV to Oral Route Change Policy  RECOMMENDATION: This patient is receiving azithromycin by the intravenous route.  Based on criteria approved by the Pharmacy and Therapeutics Committee, the antibiotic(s) is/are being converted to the equivalent oral dose form(s).   DESCRIPTION: These criteria include:  Patient being treated for a respiratory tract infection, urinary tract infection, cellulitis or clostridium difficile associated diarrhea if on metronidazole  The patient is not neutropenic and does not exhibit a GI malabsorption state  The patient is eating (either orally or via tube) and/or has been taking other orally administered medications for a least 24 hours  The patient is improving clinically and has a Tmax < 100.5  If you have questions about this conversion, please contact the Pharmacy Department  []   [phone] )  [facility] []   [phone] )  [facility] []   [phone] )  [facility] []   [phone] )  [facility] [x]   [phone] )  [place], PharmD, BCPS 05/02/2015 10:44 AM

## 2015-05-02 NOTE — Progress Notes (Signed)
NUTRITION NOTE  Pt seen for pre-DM diet education. Pt is currently on Heart Healthy diet and reports that she ate an egg white omelet with mushrooms, spinach, and onion, a piece of [place] sausage, a banana, and piece of bread for breakfast this AM.  Pt reports that she recently moved to the area after retiring from her job of 25 years in [place]. She states that she has been very stressed about the move as well as not being overly close to family and being away from the church that she is used to attending.   Talked with pt about carbohydrates, how they are important for energy and health, but eating them in appropriate portions is important to maintain blood sugars in a healthy range.  She states that due to stress she has been eating differently than she usually does. Previously pt was following diets such as Paleo and would often limit her daily carbohydrate intake to 20-50 grams/day. Pt very knowledgeable about food groups and the carbohydrate content of foods. She states that she often limits or avoids fruits due to carbohydrate count.   Since her recent move, pt reports that she has been eating bread frequently which is unusual for her. She also states her diet has mainly consisted of pizza, cookies, and other foods that she typically tries to avoid.   Based on discussion with pt, she seems to have some unrealistic food-related goals and does not have a healthy relationship with food. She states embarrassment with weight gain as well as "fear" of the grains group listed on MyPlate. She states that it is difficult for her to allow herself to indulge in items such as a cookie when she is "eating right." Pt also appears to have binging tendencies expressing that she has been eating full packages of cookies at a time, for example. Rather than understanding that slight deviations from her expected diet restrictions for herself is okay, she seems to have an "all or nothing" approach and that once she  deviates she tends to binge on unhealthy foods.  Talked with pt about maintaining a healthy relationship with food. Specifically talked about finding eating habits that she is able to maintain long-term (lifestyle changes) and understanding that it is okay to treat herself to items that she likes.   Provided pt with MyPlate visual handout and Label Reading handout from the Academy of Nutrition and Dietetics.   Expect fair compliance at this time. Pt would highly benefit from outpatient nutrition [facility] at [facility]).   Medications: Novolog (sliding scale) and scheduled: 3 units TID, Lantus: 10 units before bed. CBGs: 305-381 mg/dL. HcbA1c: 6.3%.     Jarome Matin, RD, LDN Inpatient Clinical Dietitian Pager # [phone] After hours/weekend pager # [phone]

## 2015-05-02 NOTE — Progress Notes (Signed)
CRITICAL VALUE ALERT  Critical value received:  Gram Positive Cocci in clusters in the Aerobic Bottle  Date of notification:  2:38 AM  Time of notification:  05/02/2015  Critical value read back:Yes.    Nurse who received alert:  [name]  MD notified (1st page):  Hospitalist  Time of first page:  2:38 AM

## 2015-05-02 NOTE — Progress Notes (Signed)
Pt has refused CPAP for the night.  RT to monitor and assess as needed.  

## 2015-05-02 NOTE — Progress Notes (Signed)
Progress Note   Allison Guerra CBS:[id number] DOB: [birth year] DOA: May 16, 2015 PCP: PROVIDER NOT IN SYSTEM Dr Allison Guerra, Internist [address], [place] [address] [phone]    Brief Narrative:   Allison Guerra is an 49 y.o. female with a PMH of hypertension, asthma, GERD, anxiety, OSA and vocal cord dysfunction was admitted 2015-05-16 acute onset of shortness of breath accompanied by cough and per sputum. Chest x-ray showed minimal opacity over both costophrenic angles consistent with atelectasis, but pneumonia could not be excluded.  Assessment/Plan:   Principal Problem:   Acute respiratory failure secondary to asthma exacerbation rule out community-acquired pneumonia/cough/shortness of breath - Continue nebulized broncho-dilator therapy, Mucinex and empiric antibiotics with Rocephin/azithromycin. - Continue Solumedrol 40 mg IV Q 6 hours, wean to Q 12 hours. - HIV antibody, and strep pneumonia antigen negative, follow up Legionella antigen, sputum culture and influenza panel.  Active Problems:   Bacteremia - Likely a contaminant, but follow culture results.    Hypertension - Continue Diovan/HCTZ.    Sleep apnea - CPAP Q HS.    Constipation - Colace BID ordered.    Elevated blood sugar - Hemoglobin A1c 6.3% corresponding to prediabetes. We'll have dietitian counseled the patient. - Currently being managed with insulin sensitive SSI and 10 units of Lantus daily. CBGs 305-381.  - Change SSI to resistant scale and add 6 units of meal coverage.    Anxiety - Xanax ordered. Reports improvement in symptoms.    DVT Prophylaxis - Lovenox ordered.  Family Communication: No family at bedside. Disposition Plan: Home when respiratory status improved, likely 2 days. Code Status:     Code Status Orders        Start     Ordered   05/01/15 0314  Full code   Continuous     05/01/15 0313        IV Access:    Peripheral IV   Procedures and  diagnostic studies:   Dg Chest Port 1 View  05/16/2015   CLINICAL DATA:  Productive cough, pain under both breasts since this morning with shortness of breath  EXAM: PORTABLE CHEST - 1 VIEW  COMPARISON:  None.  FINDINGS: Heart size and vascular pattern normal. Minimal density over both costophrenic angles. Lungs otherwise clear.  IMPRESSION: Minimal opacity over both costophrenic angles which appears most consistent with atelectasis. Developing pneumonia particularly on the left difficult to exclude however.   Electronically Signed   By: Allison Guerra M.D.   On: 2015/05/16 18:38     Medical Consultants:    None.  Anti-Infectives:    Rocephin May 16, 2015--->  Azithromycin 16-May-2015--->  Subjective:   Allison Guerra is still coughing, cough productive of white sputum, still feels short winded.  Reports that the Xanax has helped her anxiety symptoms and shortness of breath.  Admits to increased stress.  Objective:    Filed Vitals:   05/01/15 2014 05/01/15 2149 05/02/15 0036 05/02/15 0512  BP:  124/78  123/61  Pulse:  99  86  Temp:  97.4 F (36.3 C)  98 F (36.7 C)  TempSrc:  Oral  Oral  Resp:  20  20  Height:      Weight:      SpO2: 97% 97% 98% 96%    Intake/Output Summary (Last 24 hours) at 05/02/15 0756 Last data filed at 05/01/15 2217  Gross per 24 hour  Intake    300 ml  Output  0 ml  Net    300 ml   Filed Weights   05/01/15 1230  Weight: 114.443 kg (252 lb 4.8 oz)    Exam: Gen:  NAD Cardiovascular:  Tachy, No M/R/G Respiratory:  Lungs diminished, upper airway coarseness but no wheeze Gastrointestinal:  Abdomen soft, NT/ND, + BS Extremities:  No C/E/C   Data Reviewed:    Labs: Basic Metabolic Panel:  Recent Labs Lab 04/30/15 2101  NA 138  K 3.7  CL 104  CO2 22  GLUCOSE 249*  BUN 13  CREATININE 0.91  CALCIUM 9.6   GFR Estimated Creatinine Clearance: 91.8 mL/min (by C-G formula based on Cr of 0.91).  CBC:  Recent Labs Lab 04/30/15 2101   WBC 9.4  NEUTROABS 8.4*  HGB 12.0  HCT 36.4  MCV 85.8  PLT 251   CBG:  Recent Labs Lab 05/01/15 0750 05/01/15 1345 05/01/15 1703 05/01/15 2148 05/02/15 0742  GLUCAP 309* 305* 327* 381* 318*   Microbiology Recent Results (from the past 240 hour(s))  Culture, blood (routine x 2) Call MD if unable to obtain prior to antibiotics being given     Status: None (Preliminary result)   Collection Time: 05/01/15  4:51 AM  Result Value Ref Range Status   Specimen Description BLOOD RIGHT HAND  Final   Special Requests BOTTLES DRAWN AEROBIC ONLY 4.5ML  Final   Culture  Setup Time   Final    GRAM POSITIVE COCCI IN CLUSTERS AEROBIC BOTTLE ONLY CRITICAL RESULT CALLED TO, READ BACK BY AND VERIFIED WITH: L REINERT@0237  05/02/15 MKELLY Performed at [facility]    Culture PENDING  Incomplete   Report Status PENDING  Incomplete  Culture, sputum-assessment     Status: None   Collection Time: 05/02/15  5:17 AM  Result Value Ref Range Status   Specimen Description SPUTUM  Final   Special Requests Normal  Final   Sputum evaluation   Final    MICROSCOPIC FINDINGS SUGGEST THAT THIS SPECIMEN IS NOT REPRESENTATIVE OF LOWER RESPIRATORY SECRETIONS. PLEASE RECOLLECT. NOTIFIED Allison Anon RN AT [phone] ON 09.08.16 BY SHUEA    Report Status 05/02/2015 FINAL  Final     Medications:   . azithromycin  500 mg Intravenous Q24H  . cefTRIAXone (ROCEPHIN)  IV  1 g Intravenous Q24H  . dextromethorphan-guaiFENesin  1 tablet Oral BID  . docusate sodium  100 mg Oral BID  . enoxaparin (LOVENOX) injection  40 mg Subcutaneous Q24H  . hydrochlorothiazide  12.5 mg Oral Daily  . insulin aspart  0-9 Units Subcutaneous TID WC  . insulin glargine  10 Units Subcutaneous QHS  . ipratropium-albuterol  3 mL Nebulization QID  . irbesartan  300 mg Oral Daily  . loratadine  10 mg Oral Daily  . methylPREDNISolone (SOLU-MEDROL) injection  60 mg Intravenous 3 times per day  . omega-3 acid ethyl esters  1 capsule Oral  Daily  . pantoprazole  40 mg Oral Daily   Continuous Infusions:    Time spent: 25 minutes.   LOS: 2 days   Allison Guerra  [facility] Pager [phone]. If unable to reach me by pager, please call my cell phone at [phone].  *Please refer to [email or web address], password [email or web address] to get updated schedule on who will round on this patient, as hospitalists switch teams weekly. If 7PM-7AM, please contact night-coverage at [email or web address], password [email or web address] for any overnight needs.  05/02/2015, 7:56 AM

## 2015-05-03 LAB — GLUCOSE, CAPILLARY
GLUCOSE-CAPILLARY: 266 mg/dL — AB (ref 65–99)
GLUCOSE-CAPILLARY: 214 mg/dL — AB (ref 65–99)
Glucose-Capillary: 262 mg/dL — ABNORMAL HIGH (ref 65–99)
Glucose-Capillary: 227 mg/dL — ABNORMAL HIGH (ref 65–99)

## 2015-05-03 LAB — CULTURE, BLOOD (ROUTINE X 2)

## 2015-05-03 MED ORDER — PHENOL 1.4 % MT LIQD
1.0000 | OROMUCOSAL | Status: DC | PRN
  Administered 2015-05-03: 1 via OROMUCOSAL
  Filled 2015-05-03: qty 177

## 2015-05-03 MED ORDER — GI COCKTAIL ~~LOC~~
30.0000 mL | Freq: Once | ORAL | Status: AC
  Administered 2015-05-03: 30 mL via ORAL
  Filled 2015-05-03: qty 30

## 2015-05-03 MED ORDER — HYDROCOD POLST-CPM POLST ER 10-8 MG/5ML PO SUER
5.0000 mL | Freq: Two times a day (BID) | ORAL | Status: AC
  Administered 2015-05-03 – 2015-05-05 (×6): 5 mL via ORAL
  Filled 2015-05-03 (×6): qty 5

## 2015-05-03 NOTE — Progress Notes (Signed)
Pt. Refused cpap. Pt. States she gets claustrophobic and can't handle it. RT informed pt. To notify if she changes her mind.

## 2015-05-03 NOTE — Progress Notes (Signed)
Progress Note   Allison Guerra GUY:[id number] DOB: 06-Apr-1966 DOA: 04/30/2015 PCP: PROVIDER NOT IN SYSTEM Dr Donata Clay, Internist [address], [place] [address] [phone]  Brief Narrative:   Allison Guerra is an 49 y.o. female with a PMH of hypertension, asthma, GERD, anxiety, OSA and vocal cord dysfunction was admitted 04/30/15 acute onset of shortness of breath accompanied by cough and per sputum. Chest x-ray showed minimal opacity over both costophrenic angles consistent with atelectasis, but pneumonia could not be excluded.  Assessment/Plan:   Principal Problem:   Acute respiratory failure secondary to asthma exacerbation rule out community-acquired pneumonia/cough/shortness of breath - Continue nebulized broncho-dilator therapy, Mucinex and empiric antibiotics with Rocephin/azithromycin. - Wean Solu-Medrol to 40 mg daily today then discontinue. - HIV antibody, and strep pneumonia/Legionella antigen negative, influenza panel negative.   - Blood culture growing gram-positive cocci in clusters. Sputum specimen not representative of lower respiratory secretions.  Active Problems:   Bacteremia - Likely a contaminant, but follow culture results.    Hypertension - Continue Diovan/HCTZ.    Sleep apnea - CPAP Q HS.    Constipation - Colace BID ordered.    Elevated blood sugar - Hemoglobin A1c 6.3% corresponding to prediabetes. Counseled by diabetes coordinator and dietitian. - Currently being managed with resistant scale SSI and 6 units of meal coverage. CBGs 246-381. - Glycemic control should improve with weaning steroids. - We'll set up outpatient nutrition counseling/diabetes education per dietitian recommendations.    Anxiety - Xanax ordered. Reports improvement in symptoms.    DVT Prophylaxis - Lovenox ordered.  Family Communication: No family at bedside. Disposition Plan: Home when respiratory status improved, likely 2 days. Code  Status:     Code Status Orders        Start     Ordered   05/01/15 0314  Full code   Continuous     05/01/15 0313      IV Access:    Peripheral IV   Procedures and diagnostic studies:   No results found.   Medical Consultants:    None.  Anti-Infectives:    Rocephin 04/30/15--->  Azithromycin 04/30/15--->  Subjective:   Allison Guerra is still coughing, cough productive of green/brown sputum, still feels short winded.  Says she had trouble sleeping last night.    Objective:    Filed Vitals:   05/02/15 1625 05/02/15 2040 05/02/15 2212 05/03/15 0519  BP:   140/71 127/74  Pulse:   91 65  Temp:   98.4 F (36.9 C) 97.9 F (36.6 C)  TempSrc:   Oral Oral  Resp:   20 16  Height:      Weight:      SpO2: 98% 98% 96% 98%    Intake/Output Summary (Last 24 hours) at 05/03/15 0736 Last data filed at 05/02/15 2300  Gross per 24 hour  Intake     50 ml  Output      0 ml  Net     50 ml   Filed Weights   05/01/15 1230  Weight: 114.443 kg (252 lb 4.8 oz)    Exam: Gen:  NAD, short winded during conversation Cardiovascular:  RRR, No M/R/G Respiratory:  Lungs diminished, some high pitched wheezes Gastrointestinal:  Abdomen soft, NT/ND, + BS Extremities:  No C/E/C   Data Reviewed:    Labs: Basic Metabolic Panel:  Recent Labs Lab 04/30/15 2101  NA 138  K 3.7  CL 104  CO2 22  GLUCOSE 249*  BUN 13  CREATININE 0.91  CALCIUM 9.6   GFR Estimated Creatinine Clearance: 91.8 mL/min (by C-G formula based on Cr of 0.91).  CBC:  Recent Labs Lab 04/30/15 2101  WBC 9.4  NEUTROABS 8.4*  HGB 12.0  HCT 36.4  MCV 85.8  PLT 251   CBG:  Recent Labs Lab 05/01/15 2148 05/02/15 0742 05/02/15 1259 05/02/15 1709 05/02/15 2221  GLUCAP 381* 318* 308* 246* 341*   Microbiology Recent Results (from the past 240 hour(s))  Culture, blood (routine x 2) Call MD if unable to obtain prior to antibiotics being given     Status: None (Preliminary result)    Collection Time: 05/01/15  4:51 AM  Result Value Ref Range Status   Specimen Description BLOOD RIGHT HAND  Final   Special Requests BOTTLES DRAWN AEROBIC ONLY 4.5ML  Final   Culture  Setup Time   Final    GRAM POSITIVE COCCI IN CLUSTERS AEROBIC BOTTLE ONLY CRITICAL RESULT CALLED TO, READ BACK BY AND VERIFIED WITH: L REINERT@0237  05/02/15 MKELLY    Culture   Final    NO GROWTH 1 DAY Performed at [facility]    Report Status PENDING  Incomplete  Culture, sputum-assessment     Status: None   Collection Time: 05/02/15  5:17 AM  Result Value Ref Range Status   Specimen Description SPUTUM  Final   Special Requests Normal  Final   Sputum evaluation   Final    MICROSCOPIC FINDINGS SUGGEST THAT THIS SPECIMEN IS NOT REPRESENTATIVE OF LOWER RESPIRATORY SECRETIONS. PLEASE RECOLLECT. NOTIFIED [name] RN AT [phone] ON 09.08.16 BY SHUEA    Report Status 05/02/2015 FINAL  Final     Medications:   . azithromycin  500 mg Oral QHS  . cefTRIAXone (ROCEPHIN)  IV  1 g Intravenous Q24H  . dextromethorphan-guaiFENesin  1 tablet Oral BID  . docusate sodium  100 mg Oral BID  . enoxaparin (LOVENOX) injection  40 mg Subcutaneous Q24H  . hydrochlorothiazide  12.5 mg Oral Daily  . insulin aspart  0-20 Units Subcutaneous TID WC  . insulin aspart  0-5 Units Subcutaneous QHS  . insulin aspart  6 Units Subcutaneous TID WC  . insulin glargine  10 Units Subcutaneous QHS  . ipratropium-albuterol  3 mL Nebulization QID  . irbesartan  300 mg Oral Daily  . loratadine  10 mg Oral Daily  . methylPREDNISolone (SOLU-MEDROL) injection  60 mg Intravenous Q12H  . omega-3 acid ethyl esters  1 capsule Oral Daily  . pantoprazole  40 mg Oral Daily   Continuous Infusions:    Time spent: 25 minutes.   LOS: 3 days   [facility] Pager [phone]. If unable to reach me by pager, please call my cell phone at [phone].  *Please refer to [email or web address], password [email or web address] to get updated schedule on  who will round on this patient, as hospitalists switch teams weekly. If 7PM-7AM, please contact night-coverage at [email or web address], password [email or web address] for any overnight needs.  05/03/2015, 7:36 AM

## 2015-05-03 NOTE — Progress Notes (Signed)
Inpatient Diabetes Program Recommendations  AACE/ADA: New Consensus Statement on Inpatient Glycemic Control (2013)  Target Ranges:  Prepandial:   less than 140 mg/dL      Peak postprandial:   less than 180 mg/dL (1-2 hours)      Critically ill patients:  140 - 180 mg/dL  Results for Allison Guerra, Allison Guerra (MRN [id number]) as of 05/03/2015 12:42  Ref. Range 05/02/2015 12:59 05/02/2015 17:09 05/02/2015 22:21 05/03/2015 07:38 05/03/2015 12:00  Glucose-Capillary Latest Ref Range: 65-99 mg/dL 308 (H) 246 (H) 341 (H) 266 (H) 262 (H)   Inpatient Diabetes Program Recommendations Insulin - Basal: increase Lantus to 20 units during steroid therapy Thank you  Raoul Pitch BSN, RN,CDE Inpatient Diabetes Coordinator [phone] (team pager)

## 2015-05-04 DIAGNOSIS — K5909 Other constipation: Secondary | ICD-10-CM

## 2015-05-04 LAB — GLUCOSE, CAPILLARY
GLUCOSE-CAPILLARY: 307 mg/dL — AB (ref 65–99)
GLUCOSE-CAPILLARY: 242 mg/dL — AB (ref 65–99)
Glucose-Capillary: 215 mg/dL — ABNORMAL HIGH (ref 65–99)
Glucose-Capillary: 155 mg/dL — ABNORMAL HIGH (ref 65–99)

## 2015-05-04 MED ORDER — MENTHOL 3 MG MT LOZG
1.0000 | LOZENGE | OROMUCOSAL | Status: DC | PRN
  Filled 2015-05-04: qty 9

## 2015-05-04 MED ORDER — IBUPROFEN 200 MG PO TABS
600.0000 mg | ORAL_TABLET | Freq: Four times a day (QID) | ORAL | Status: DC | PRN
  Administered 2015-05-04 – 2015-05-08 (×8): 600 mg via ORAL
  Filled 2015-05-04 (×8): qty 3

## 2015-05-04 MED ORDER — METHYLPREDNISOLONE SODIUM SUCC 125 MG IJ SOLR
60.0000 mg | Freq: Two times a day (BID) | INTRAMUSCULAR | Status: AC
Start: 2015-05-04 — End: 2015-05-05
  Administered 2015-05-04 – 2015-05-05 (×3): 60 mg via INTRAVENOUS
  Filled 2015-05-04 (×3): qty 2

## 2015-05-04 NOTE — Progress Notes (Signed)
Progress Note   Allison Guerra IWP:[id number] DOB: 1966/04/11 DOA: 04/30/2015 PCP: PROVIDER NOT IN SYSTEM Dr Donata Clay, Internist [address], [place] [address] [phone]  Brief Narrative:   Allison Guerra is an 49 y.o. female with a PMH of hypertension, asthma, GERD, anxiety, OSA and vocal cord dysfunction was admitted 04/30/15 acute onset of shortness of breath accompanied by cough and per sputum. Chest x-ray showed minimal opacity over both costophrenic angles consistent with atelectasis, but pneumonia could not be excluded.  Assessment/Plan:   Principal Problem:   Acute respiratory failure secondary to asthma exacerbation rule out community-acquired pneumonia/cough/shortness of breath - Continue nebulized broncho-dilator therapy, Mucinex and empiric antibiotics with Rocephin/azithromycin. - Continue Solumedrol given ongoing symptoms. - HIV antibody, and strep pneumonia/Legionella antigen negative, influenza panel negative.   - Blood culture growing gram-positive cocci in clusters. Sputum specimen not representative of lower respiratory secretions.  Active Problems:   Bacteremia / Contamination - Likely a contaminant (coagulase-negative staph).    Hypertension - Continue Diovan/HCTZ.    Sleep apnea - Refuses CPAP.    Constipation - Colace BID ordered.    Elevated blood sugar - Hemoglobin A1c 6.3% corresponding to prediabetes. Counseled by diabetes coordinator and dietitian. - Currently being managed with resistant scale SSI and 6 units of meal coverage. CBGs 214-266. - Glycemic control should improve with weaning steroids. - We'll set up outpatient nutrition counseling/diabetes education per dietitian recommendations.    Anxiety - Xanax ordered. Reports improvement in symptoms.    DVT Prophylaxis - Lovenox ordered.  Family Communication: No family at bedside. Disposition Plan: Home when respiratory status improved, likely  tomorrow. Code Status:     Code Status Orders        Start     Ordered   05/01/15 0314  Full code   Continuous     05/01/15 0313      IV Access:    Peripheral IV   Procedures and diagnostic studies:   No results found.   Medical Consultants:    None.  Anti-Infectives:    Rocephin 04/30/15--->  Azithromycin 04/30/15--->  Subjective:   Allison Guerra is still coughing, sputum mostly clear.  Still feels very short of breath.  Concerned that mold ingestion may have triggered her symptoms.  Objective:    Filed Vitals:   05/03/15 1925 05/03/15 2207 05/04/15 0513 05/04/15 0541  BP:  134/66 124/64   Pulse:  66 68   Temp:  98.7 F (37.1 C) 97.8 F (36.6 C)   TempSrc:  Oral Oral   Resp:  20 20   Height:      Weight:      SpO2: 98% 98% 97% 97%    Intake/Output Summary (Last 24 hours) at 05/04/15 0747 Last data filed at 05/03/15 2300  Gross per 24 hour  Intake   1370 ml  Output      0 ml  Net   1370 ml   Filed Weights   05/01/15 1230  Weight: 114.443 kg (252 lb 4.8 oz)    Exam: Gen:  NAD, short winded during conversation Cardiovascular:  RRR, No M/R/G Respiratory:  Lungs diminished, some high pitched wheezes Gastrointestinal:  Abdomen soft, NT/ND, + BS Extremities:  No C/E/C   Data Reviewed:    Labs: Basic Metabolic Panel:  Recent Labs Lab 04/30/15 2101  NA 138  K 3.7  CL 104  CO2 22  GLUCOSE 249*  BUN 13  CREATININE 0.91  CALCIUM 9.6   GFR Estimated Creatinine Clearance: 91.8 mL/min (by C-G formula based on Cr of 0.91).  CBC:  Recent Labs Lab 04/30/15 2101  WBC 9.4  NEUTROABS 8.4*  HGB 12.0  HCT 36.4  MCV 85.8  PLT 251   CBG:  Recent Labs Lab 05/03/15 0738 05/03/15 1200 05/03/15 1624 05/03/15 2209 05/04/15 0730  GLUCAP 266* 262* 214* 227* 215*   Microbiology Recent Results (from the past 240 hour(s))  Culture, blood (routine x 2) Call MD if unable to obtain prior to antibiotics being given     Status: None    Collection Time: 05/01/15  4:51 AM  Result Value Ref Range Status   Specimen Description BLOOD RIGHT HAND  Final   Special Requests BOTTLES DRAWN AEROBIC ONLY 4.5ML  Final   Culture  Setup Time   Final    GRAM POSITIVE COCCI IN CLUSTERS AEROBIC BOTTLE ONLY CRITICAL RESULT CALLED TO, READ BACK BY AND VERIFIED WITH: L REINERT@0237  05/02/15 MKELLY    Culture   Final    STAPHYLOCOCCUS SPECIES (COAGULASE NEGATIVE) THE SIGNIFICANCE OF ISOLATING THIS ORGANISM FROM A SINGLE VENIPUNCTURE CANNOT BE PREDICTED WITHOUT FURTHER CLINICAL AND CULTURE CORRELATION. SUSCEPTIBILITIES AVAILABLE ONLY ON REQUEST. Performed at [facility]    Report Status 05/03/2015 FINAL  Final  Culture, sputum-assessment     Status: None   Collection Time: 05/02/15  5:17 AM  Result Value Ref Range Status   Specimen Description SPUTUM  Final   Special Requests Normal  Final   Sputum evaluation   Final    MICROSCOPIC FINDINGS SUGGEST THAT THIS SPECIMEN IS NOT REPRESENTATIVE OF LOWER RESPIRATORY SECRETIONS. PLEASE RECOLLECT. NOTIFIED Allison Anon RN AT [phone] ON 09.08.16 BY SHUEA    Report Status 05/02/2015 FINAL  Final     Medications:   . azithromycin  500 mg Oral QHS  . cefTRIAXone (ROCEPHIN)  IV  1 g Intravenous Q24H  . chlorpheniramine-HYDROcodone  5 mL Oral Q12H  . dextromethorphan-guaiFENesin  1 tablet Oral BID  . docusate sodium  100 mg Oral BID  . enoxaparin (LOVENOX) injection  40 mg Subcutaneous Q24H  . hydrochlorothiazide  12.5 mg Oral Daily  . insulin aspart  0-20 Units Subcutaneous TID WC  . insulin aspart  0-5 Units Subcutaneous QHS  . insulin aspart  6 Units Subcutaneous TID WC  . insulin glargine  10 Units Subcutaneous QHS  . ipratropium-albuterol  3 mL Nebulization QID  . irbesartan  300 mg Oral Daily  . loratadine  10 mg Oral Daily  . omega-3 acid ethyl esters  1 capsule Oral Daily  . pantoprazole  40 mg Oral Daily   Continuous Infusions:    Time spent: 25 minutes.   LOS: 4 days    [facility] Pager [phone]. If unable to reach me by pager, please call my cell phone at [phone].  *Please refer to [email or web address], password [email or web address] to get updated schedule on who will round on this patient, as hospitalists switch teams weekly. If 7PM-7AM, please contact night-coverage at [email or web address], password [email or web address] for any overnight needs.  05/04/2015, 7:47 AM

## 2015-05-04 NOTE — Progress Notes (Signed)
Pt refused nocturnal CPAP.

## 2015-05-05 LAB — GLUCOSE, CAPILLARY
GLUCOSE-CAPILLARY: 337 mg/dL — AB (ref 65–99)
Glucose-Capillary: 246 mg/dL — ABNORMAL HIGH (ref 65–99)

## 2015-05-05 NOTE — Progress Notes (Signed)
Progress Note   Allison Guerra OMB:[id number] DOB: 02-Dec-1965 DOA: 04/30/2015 PCP: PROVIDER NOT IN SYSTEM Dr Donata Clay, Internist [address], [place] [address] [phone]  Brief Narrative:   Allison Guerra is an 49 y.o. female with a PMH of hypertension, asthma, GERD, anxiety, OSA and vocal cord dysfunction was admitted 04/30/15 acute onset of shortness of breath accompanied by cough and per sputum. Chest x-ray showed minimal opacity over both costophrenic angles consistent with atelectasis, but pneumonia could not be excluded.  Assessment/Plan:   Principal Problem:   Acute respiratory failure secondary to asthma exacerbation rule out community-acquired pneumonia/cough/shortness of breath - Continue nebulized broncho-dilator therapy, Mucinex and empiric antibiotics with Rocephin/azithromycin. - Continue Solumedrol given ongoing symptoms. - HIV antibody, and strep pneumonia/Legionella antigen negative, influenza panel negative.   - Blood culture growing gram-positive cocci in clusters. Sputum specimen not representative of lower respiratory secretions.  Active Problems:   Dizziness - Not orthostatic.    Bacteremia / Contamination - Likely a contaminant (coagulase-negative staph).    Hypertension - Continue Diovan/HCTZ.    Sleep apnea - Refuses CPAP.    Constipation - Colace BID ordered.    Elevated blood sugar - Hemoglobin A1c 6.3% corresponding to prediabetes. Counseled by diabetes coordinator and dietitian. - Currently being managed with resistant scale SSI and 6 units of meal coverage. CBGs 155-337. - Glycemic control should improve with weaning steroids. - We'll set up outpatient nutrition counseling/diabetes education per dietitian recommendations.    Anxiety - Xanax ordered. Reports improvement in symptoms.    DVT Prophylaxis - Lovenox ordered.  Family Communication: No family at bedside. Disposition Plan: Home when respiratory  status improved, likely tomorrow. Code Status:     Code Status Orders        Start     Ordered   05/01/15 0314  Full code   Continuous     05/01/15 0313      IV Access:    Peripheral IV   Procedures and diagnostic studies:   No results found.   Medical Consultants:    None.  Anti-Infectives:    Rocephin 04/30/15--->  Azithromycin 04/30/15--->  Subjective:   Allison Guerra tells me she is too dizzy to go home today. She lives alone and tells me she does not feel safe to go home without someone there to help her. She continues to report significant shortness of breath, especially with activity. Of note, the patient is not orthostatic and maintains her oxygen saturations even with activity.  Objective:    Filed Vitals:   05/05/15 0422 05/05/15 0451 05/05/15 0458 05/05/15 0740  BP: 135/74 132/64    Pulse: 74 63    Temp: 98.1 F (36.7 C) 98 F (36.7 C)    TempSrc: Oral Oral    Resp: 18 18    Height:  5\' 4"  (1.626 m)    Weight:  113.581 kg (250 lb 6.4 oz)    SpO2: 99% 97% 98% 98%    Intake/Output Summary (Last 24 hours) at 05/05/15 0928 Last data filed at 05/05/15 2500  Gross per 24 hour  Intake   1230 ml  Output    400 ml  Net    830 ml   Filed Weights   05/01/15 1230 05/05/15 0451  Weight: 114.443 kg (252 lb 4.8 oz) 113.581 kg (250 lb 6.4 oz)    Exam: Gen:  NAD, anxious Cardiovascular:  RRR, No M/R/G Respiratory:  Lungs diminished, coarseness to  the upper airway consistent with VCD Gastrointestinal:  Abdomen soft, NT/ND, + BS Extremities:  No C/E/C   Data Reviewed:    Labs: Basic Metabolic Panel:  Recent Labs Lab 04/30/15 2101  NA 138  K 3.7  CL 104  CO2 22  GLUCOSE 249*  BUN 13  CREATININE 0.91  CALCIUM 9.6   GFR Estimated Creatinine Clearance: 91.4 mL/min (by C-G formula based on Cr of 0.91).  CBC:  Recent Labs Lab 04/30/15 2101  WBC 9.4  NEUTROABS 8.4*  HGB 12.0  HCT 36.4  MCV 85.8  PLT 251   CBG:  Recent  Labs Lab 05/04/15 0730 05/04/15 1155 05/04/15 1703 05/04/15 2203 05/05/15 0755  GLUCAP 215* 155* 307* 242* 337*   Microbiology Recent Results (from the past 240 hour(s))  Culture, blood (routine x 2) Call MD if unable to obtain prior to antibiotics being given     Status: None   Collection Time: 05/01/15  4:51 AM  Result Value Ref Range Status   Specimen Description BLOOD RIGHT HAND  Final   Special Requests BOTTLES DRAWN AEROBIC ONLY 4.5ML  Final   Culture  Setup Time   Final    GRAM POSITIVE COCCI IN CLUSTERS AEROBIC BOTTLE ONLY CRITICAL RESULT CALLED TO, READ BACK BY AND VERIFIED WITH: L REINERT@0237  05/02/15 MKELLY    Culture   Final    STAPHYLOCOCCUS SPECIES (COAGULASE NEGATIVE) THE SIGNIFICANCE OF ISOLATING THIS ORGANISM FROM A SINGLE VENIPUNCTURE CANNOT BE PREDICTED WITHOUT FURTHER CLINICAL AND CULTURE CORRELATION. SUSCEPTIBILITIES AVAILABLE ONLY ON REQUEST. Performed at [facility]    Report Status 05/03/2015 FINAL  Final  Culture, sputum-assessment     Status: None   Collection Time: 05/02/15  5:17 AM  Result Value Ref Range Status   Specimen Description SPUTUM  Final   Special Requests Normal  Final   Sputum evaluation   Final    MICROSCOPIC FINDINGS SUGGEST THAT THIS SPECIMEN IS NOT REPRESENTATIVE OF LOWER RESPIRATORY SECRETIONS. PLEASE RECOLLECT. NOTIFIED [name] RN AT [phone] ON 09.08.16 BY SHUEA    Report Status 05/02/2015 FINAL  Final     Medications:   . azithromycin  500 mg Oral QHS  . cefTRIAXone (ROCEPHIN)  IV  1 g Intravenous Q24H  . chlorpheniramine-HYDROcodone  5 mL Oral Q12H  . dextromethorphan-guaiFENesin  1 tablet Oral BID  . docusate sodium  100 mg Oral BID  . enoxaparin (LOVENOX) injection  40 mg Subcutaneous Q24H  . hydrochlorothiazide  12.5 mg Oral Daily  . insulin aspart  0-20 Units Subcutaneous TID WC  . insulin aspart  0-5 Units Subcutaneous QHS  . insulin aspart  6 Units Subcutaneous TID WC  . insulin glargine  10 Units  Subcutaneous QHS  . ipratropium-albuterol  3 mL Nebulization QID  . irbesartan  300 mg Oral Daily  . loratadine  10 mg Oral Daily  . methylPREDNISolone (SOLU-MEDROL) injection  60 mg Intravenous Q12H  . omega-3 acid ethyl esters  1 capsule Oral Daily  . pantoprazole  40 mg Oral Daily   Continuous Infusions:    Time spent: 25 minutes.   LOS: 5 days   [place] [facility] Pager [phone]. If unable to reach me by pager, please call my cell phone at [phone].  *Please refer to [email or web address], password [email or web address] to get updated schedule on who will round on this patient, as hospitalists switch teams weekly. If 7PM-7AM, please contact night-coverage at [email or web address], password [email or web address] for any overnight needs.  05/05/2015, 9:28 AM

## 2015-05-05 NOTE — Progress Notes (Signed)
Patient continues to decline the use of nocturnal CPAP. Order discontinues per RT protocol.

## 2015-05-05 NOTE — Progress Notes (Signed)
Patient arrived on the unit from [facility] [place] via w/c at approximately [phone]. Patient had dyspnea at rest with notable expiratory wheezing. Patient was given a prn neb with good effect. No other complaints voiced.

## 2015-05-05 NOTE — Care Management Note (Signed)
Case Management Note  Patient Details  Name: Allison Guerra MRN: [id number] Date of Birth: Apr 22, 1966  Subjective/Objective:                 asthma exacerbation    Action/Plan: Pt reports she lives at home alone, she is requesting HH at time of dc. States she extremely short of breath when she ambulates. Pt states her last dc from hospital she had a HHRN and aide, pt unsure of date. Explained if [facility] RN is ordered NCM will set up services with agency that accepts her insurance. She also wants aide to assist with bathing and light housework. Explained insurance does not cover for aide or personal care assistant that does cooking or light housework. Explained aide/personal care assistance that provides cooking, lighthouse work, and assist with getting to doctor's appts is usually private pay/out of pocket. Provided pt with [facility] agency list. Waiting final recommendations for home.   Expected Discharge Date:   (unknown)               Expected Discharge Plan:  [place]  In-House Referral:      Status of Service:  In process, will continue to follow  Medicare Important Message Given:    Date Medicare IM Given:    Medicare IM give by:    Date Additional Medicare IM Given:    Additional Medicare Important Message give by:     If discussed at [place] of Stay Meetings, dates discussed:    Additional Comments:  Erenest Rasher, RN 05/05/2015, 4:53 PM

## 2015-05-05 NOTE — Progress Notes (Signed)
Report given to [place] on 3W. Pt transported via wheelchair to 1338 with two "pt belonging" bags. Ensured pt had all of her personal items before transfer.

## 2015-05-06 ENCOUNTER — Inpatient Hospital Stay (HOSPITAL_COMMUNITY): Payer: Managed Care, Other (non HMO)

## 2015-05-06 LAB — GLUCOSE, CAPILLARY
GLUCOSE-CAPILLARY: 107 mg/dL — AB (ref 65–99)
GLUCOSE-CAPILLARY: 98 mg/dL (ref 65–99)
Glucose-Capillary: 244 mg/dL — ABNORMAL HIGH (ref 65–99)
Glucose-Capillary: 148 mg/dL — ABNORMAL HIGH (ref 65–99)
Glucose-Capillary: 114 mg/dL — ABNORMAL HIGH (ref 65–99)
Glucose-Capillary: 135 mg/dL — ABNORMAL HIGH (ref 65–99)

## 2015-05-06 MED ORDER — CEFUROXIME AXETIL 500 MG PO TABS: 500.0000 mg | ORAL_TABLET | Freq: Two times a day (BID) | ORAL | Status: DC

## 2015-05-06 MED ORDER — LIP MEDEX EX OINT
TOPICAL_OINTMENT | CUTANEOUS | Status: AC
  Administered 2015-05-06: 11:00:00
  Filled 2015-05-06: qty 7

## 2015-05-06 MED ORDER — ACETAMINOPHEN 325 MG PO TABS
650.0000 mg | ORAL_TABLET | Freq: Four times a day (QID) | ORAL | Status: DC | PRN
  Filled 2015-05-06 (×2): qty 2

## 2015-05-06 MED ORDER — SACCHAROMYCES BOULARDII 250 MG PO CAPS
250.0000 mg | ORAL_CAPSULE | Freq: Two times a day (BID) | ORAL | Status: DC
  Administered 2015-05-06 – 2015-05-08 (×4): 250 mg via ORAL
  Filled 2015-05-06 (×4): qty 1

## 2015-05-06 NOTE — Progress Notes (Signed)
Progress Note   Allison Guerra TGG:[id number] DOB: August 30, 1965 DOA: 04/30/2015 PCP: PROVIDER NOT IN SYSTEM Dr Donata Clay, Internist [address], [place] [address] [phone]  Brief Narrative:   Allison Guerra is an 49 y.o. female with a PMH of hypertension, asthma, GERD, anxiety, OSA and vocal cord dysfunction was admitted 04/30/15 acute onset of shortness of breath accompanied by cough and per sputum. Chest x-ray showed minimal opacity over both costophrenic angles consistent with atelectasis, but pneumonia could not be excluded.  Assessment/Plan:   Principal Problem:   Acute respiratory failure secondary to asthma exacerbation rule out community-acquired pneumonia/cough/shortness of breath - Continue nebulized broncho-dilator therapy, Mucinex and empiric antibiotics with Rocephin/azithromycin. - Solu-Medrol discontinued 05/05/15, unable to tolerate prednisone. - HIV antibody, and strep pneumonia/Legionella antigen negative, influenza panel negative.   - Sputum specimen not representative of lower respiratory secretions.  - Pulmonology consulted for assistance with management given ongoing symptoms/activity intolerance.  Active Problems:   Abdominal distention - Check KUB.  Start probiotic.    Dizziness - Not orthostatic.    Bacteremia / Contamination - Likely a contaminant (coagulase-negative staph).    Hypertension - Continue Diovan/HCTZ.    Sleep apnea - Refuses CPAP.    Constipation - Colace BID ordered.    Elevated blood sugar - Hemoglobin A1c 6.3% corresponding to prediabetes. Counseled by diabetes coordinator and dietitian. - Currently being managed with resistant scale SSI and 6 units of meal coverage. CBGs 155-337. - Glycemic control should improve with weaning steroids. - We'll set up outpatient nutrition counseling/diabetes education per dietitian recommendations.    Anxiety - Xanax ordered. Reports improvement in symptoms.   DVT Prophylaxis - Lovenox ordered.  Family Communication: No family at bedside. Disposition Plan: Home when respiratory status improved, likely tomorrow. Code Status:     Code Status Orders        Start     Ordered   05/01/15 0314  Full code   Continuous     05/01/15 0313      IV Access:    Peripheral IV   Procedures and diagnostic studies:   No results found.   Medical Consultants:    None.  Anti-Infectives:    Rocephin 04/30/15--->  Azithromycin 04/30/15--->  Subjective:   Allison Guerra reports ongoing dizziness, shortness of breath that limits activity.  She now also complains of abdominal distention, "My stomach feels hard" and abdominal discomfort/pain.  No vomiting.  1 episode of diarrhea/24 hours.  Objective:    Filed Vitals:   05/05/15 1320 05/05/15 2202 05/06/15 0448 05/06/15 0735  BP: 133/76 143/78 135/70   Pulse: 66 64 60   Temp: 98.3 F (36.8 C) 98.4 F (36.9 C) 98.2 F (36.8 C)   TempSrc: Oral Oral Oral   Resp: 20 20 20    Height:      Weight:      SpO2: 100% 100% 99% 97%    Intake/Output Summary (Last 24 hours) at 05/06/15 0744 Last data filed at 05/06/15 0720  Gross per 24 hour  Intake   1440 ml  Output   3400 ml  Net  -1960 ml   Filed Weights   05/01/15 1230 05/05/15 0451  Weight: 114.443 kg (252 lb 4.8 oz) 113.581 kg (250 lb 6.4 oz)    Exam: Gen:  NAD, anxious Cardiovascular:  RRR, No M/R/G Respiratory:  Lungs diminished, coarseness to the upper airway consistent with VCD Gastrointestinal:  Abdomen soft, NT/ND, + BS Extremities:  No C/E/C   Data Reviewed:    Labs: Basic Metabolic Panel:  Recent Labs Lab 04/30/15 2101  NA 138  K 3.7  CL 104  CO2 22  GLUCOSE 249*  BUN 13  CREATININE 0.91  CALCIUM 9.6   GFR Estimated Creatinine Clearance: 91.4 mL/min (by C-G formula based on Cr of 0.91).  CBC:  Recent Labs Lab 04/30/15 2101  WBC 9.4  NEUTROABS 8.4*  HGB 12.0  HCT 36.4  MCV 85.8  PLT 251    CBG:  Recent Labs Lab 05/04/15 1155 05/04/15 1703 05/04/15 2203 05/05/15 0755 05/05/15 1120  GLUCAP 155* 307* 242* 337* 246*   Microbiology Recent Results (from the past 240 hour(s))  Culture, blood (routine x 2) Call MD if unable to obtain prior to antibiotics being given     Status: None   Collection Time: 05/01/15  4:51 AM  Result Value Ref Range Status   Specimen Description BLOOD RIGHT HAND  Final   Special Requests BOTTLES DRAWN AEROBIC ONLY 4.5ML  Final   Culture  Setup Time   Final    GRAM POSITIVE COCCI IN CLUSTERS AEROBIC BOTTLE ONLY CRITICAL RESULT CALLED TO, READ BACK BY AND VERIFIED WITH: L REINERT@0237  05/02/15 MKELLY    Culture   Final    STAPHYLOCOCCUS SPECIES (COAGULASE NEGATIVE) THE SIGNIFICANCE OF ISOLATING THIS ORGANISM FROM A SINGLE VENIPUNCTURE CANNOT BE PREDICTED WITHOUT FURTHER CLINICAL AND CULTURE CORRELATION. SUSCEPTIBILITIES AVAILABLE ONLY ON REQUEST. Performed at [facility]    Report Status 05/03/2015 FINAL  Final  Culture, sputum-assessment     Status: None   Collection Time: 05/02/15  5:17 AM  Result Value Ref Range Status   Specimen Description SPUTUM  Final   Special Requests Normal  Final   Sputum evaluation   Final    MICROSCOPIC FINDINGS SUGGEST THAT THIS SPECIMEN IS NOT REPRESENTATIVE OF LOWER RESPIRATORY SECRETIONS. PLEASE RECOLLECT. NOTIFIED [name] RN AT [phone] ON 09.08.16 BY SHUEA    Report Status 05/02/2015 FINAL  Final     Medications:   . azithromycin  500 mg Oral QHS  . cefTRIAXone (ROCEPHIN)  IV  1 g Intravenous Q24H  . dextromethorphan-guaiFENesin  1 tablet Oral BID  . docusate sodium  100 mg Oral BID  . enoxaparin (LOVENOX) injection  40 mg Subcutaneous Q24H  . hydrochlorothiazide  12.5 mg Oral Daily  . insulin aspart  0-20 Units Subcutaneous TID WC  . insulin aspart  0-5 Units Subcutaneous QHS  . insulin aspart  6 Units Subcutaneous TID WC  . insulin glargine  10 Units Subcutaneous QHS  .  ipratropium-albuterol  3 mL Nebulization QID  . irbesartan  300 mg Oral Daily  . loratadine  10 mg Oral Daily  . omega-3 acid ethyl esters  1 capsule Oral Daily  . pantoprazole  40 mg Oral Daily   Continuous Infusions:    Time spent: 25 minutes.   LOS: 6 days   [place] [facility] Pager [phone]. If unable to reach me by pager, please call my cell phone at [phone].  *Please refer to [email or web address], password [email or web address] to get updated schedule on who will round on this patient, as hospitalists switch teams weekly. If 7PM-7AM, please contact night-coverage at [email or web address], password [email or web address] for any overnight needs.  05/06/2015, 7:44 AM

## 2015-05-06 NOTE — Evaluation (Signed)
Physical Therapy Evaluation Patient Details Name: Allison Guerra MRN: [id number] DOB: January 28, 1966 Today's Date: 05/06/2015   History of Present Illness  49 y.o. female with a PMH of hypertension, asthma, GERD, anxiety, OSA and vocal cord dysfunction was admitted 04/30/15 for acute respiratory failure secondary to asthma exacerbation rule out community-acquired pneumonia/cough/shortness of breath  Clinical Impression  Pt admitted with above diagnosis. Pt currently with functional limitations due to the deficits listed below (see PT Problem List).  Pt will benefit from skilled PT to increase their independence and safety with mobility to allow discharge to the venue listed below.  Pt with limited ambulation distance due to dizziness and reported DOE however SPO2 and HR remained WNL during activity.  Pt reports she lives in second floor apt and alone.  Pt has a daughter in [place] however states she has children and would not be able to assist.  Pt reports she has been discussing home care with CM.   Pt declines using assistive device today however seemed agreeable to HHPT.     Follow Up Recommendations Home health PT    Equipment Recommendations  None recommended by PT (would recommend [facility] however pt declines assistive devices)    Recommendations for Other Services       Precautions / Restrictions Precautions Precautions: Fall      Mobility  Bed Mobility Overal bed mobility: Modified Independent             General bed mobility comments: dizziness upon sitting  Transfers Overall transfer level: Needs assistance Equipment used: None Transfers: Sit to/from Stand Sit to Stand: Min guard         General transfer comment: min/guard for safety due to reported dizziness  Ambulation/Gait Ambulation/Gait assistance: Min guard Ambulation Distance (Feet): 40 Feet Assistive device: None Gait Pattern/deviations: Step-through pattern;Decreased stride length     General Gait Details:  slow and slightly unsteady gait, ambulated near hand railing in case of need for UE support, pt declined using SPC or RW, required one seated rest break due to dizziness, SPO2 97% or above on room air and HR WNL during ambulation, pt reports DOE limiting distance  Financial trader [employer] (Stroke Patients Only)       Balance                                             Pertinent Vitals/Pain Pain Assessment: 0-10 Pain Score: 5  Pain Location: head Pain Descriptors / Indicators: Aching Pain Intervention(s): Monitored during session;Limited activity within patient's tolerance;Other (comment) (reports RN to bring meds at 4pm)    Home Living Family/patient expects to be discharged to:: Private residence Living Arrangements: Alone   Type of Home: Apartment Home Access: Stairs to enter   [employer] of Steps: flight Home Layout: One level Home Equipment: None      Prior Function Level of Independence: Independent         Comments: retired Network engineer from [employer], recently moved to [employer] [place]        Extremity/Trunk Assessment   Upper Extremity Assessment: RUE deficits/detail RUE Deficits / Details: reported numbness R UE since this morning, functionally did not observe deficits         Lower Extremity Assessment: Generalized weakness  Communication   Communication: No difficulties  Cognition Arousal/Alertness: Awake/alert Behavior During Therapy: WFL for tasks assessed/performed Overall Cognitive Status: Within Functional Limits for tasks assessed                      General Comments      Exercises        Assessment/Plan    PT Assessment Patient needs continued PT services  PT Diagnosis Difficulty walking   PT Problem List Decreased strength;Decreased activity tolerance;Decreased mobility  PT Treatment Interventions DME instruction;Gait training;Stair  training;Patient/family education;Functional mobility training;Therapeutic activities;Therapeutic exercise   PT Goals (Current goals can be found in the Care Plan section) Acute Rehab PT Goals PT Goal Formulation: With patient Time For Goal Achievement: 05/13/15 Potential to Achieve Goals: Good    Frequency Min 3X/week   Barriers to discharge        Co-evaluation               End of Session Equipment Utilized During Treatment: Gait belt Activity Tolerance: Patient limited by fatigue Patient left: in bed;with call bell/phone within reach           Time: 1610-9604 PT Time Calculation (min) (ACUTE ONLY): 18 min   Charges:   PT Evaluation $Initial PT Evaluation Tier I: 1 Procedure     PT G Codes:        Allison Guerra,Allison Guerra 05/06/2015, 3:59 PM Allison Guerra, PT, DPT 05/06/2015 Pager: [phone]

## 2015-05-06 NOTE — Discharge Instructions (Signed)

## 2015-05-07 LAB — GLUCOSE, CAPILLARY: GLUCOSE-CAPILLARY: 101 mg/dL — AB (ref 65–99)

## 2015-05-07 MED ORDER — POLYETHYLENE GLYCOL 3350 17 G PO PACK
17.0000 g | PACK | Freq: Every day | ORAL | Status: DC | PRN
  Administered 2015-05-08 (×2): 17 g via ORAL
  Filled 2015-05-07 (×2): qty 1

## 2015-05-07 MED ORDER — IPRATROPIUM-ALBUTEROL 0.5-2.5 (3) MG/3ML IN SOLN
3.0000 mL | Freq: Three times a day (TID) | RESPIRATORY_TRACT | Status: DC
  Administered 2015-05-07 – 2015-05-08 (×2): 3 mL via RESPIRATORY_TRACT
  Filled 2015-05-07 (×3): qty 3

## 2015-05-07 MED ORDER — CLOTRIMAZOLE 1 % VA CREA
1.0000 | TOPICAL_CREAM | Freq: Every day | VAGINAL | Status: DC
  Administered 2015-05-08: 1 via VAGINAL
  Filled 2015-05-07: qty 45

## 2015-05-07 MED ORDER — HYDROCOD POLST-CPM POLST ER 10-8 MG/5ML PO SUER
5.0000 mL | Freq: Two times a day (BID) | ORAL | Status: DC | PRN
  Administered 2015-05-07: 5 mL via ORAL
  Filled 2015-05-07: qty 5

## 2015-05-07 MED ORDER — FLEET ENEMA 7-19 GM/118ML RE ENEM
1.0000 | ENEMA | Freq: Once | RECTAL | Status: AC
  Administered 2015-05-07: 1 via RECTAL
  Filled 2015-05-07: qty 1

## 2015-05-07 MED ORDER — PANTOPRAZOLE SODIUM 40 MG PO TBEC
40.0000 mg | DELAYED_RELEASE_TABLET | Freq: Two times a day (BID) | ORAL | Status: DC
  Administered 2015-05-07 – 2015-05-08 (×3): 40 mg via ORAL
  Filled 2015-05-07 (×3): qty 1

## 2015-05-07 NOTE — Consult Note (Signed)
Name: Allison Guerra MRN: [id number] DOB: 08-20-1966    ADMISSION DATE:  04/30/2015 CONSULTATION DATE:  05/07/15  REFERRING MD :  [facility]   CHIEF COMPLAINT:  Asthma exacerbation/CAP   BRIEF PATIENT DESCRIPTION: 75 yof PMH of HTN, Asthma, GERD, anxiety, OSA, and vocal cord dysfunction with SOB, productive cough admitted for asthma exac vs. CAP. On 9/13, pt with persistent SOB, DOE, and cough not improved with O2, nebs, atbx, and steroids.  SIGNIFICANT EVENTS  9/13>> Pt c/o persistent SOB, DOE, activity intolerance   STUDIES:  9/6 CXR>> Bilateral atelectasis, L sided pneumonia   HISTORY OF PRESENT ILLNESS:   49 y.o. female with PMH of hypertension, asthma, GERD, anxiety, OSA, vocal cord dysfunction, who presented to [facility] on 9/6 with several day h/o subjective fever, shortness of breath and productive cough w/ yellow sputum, and increased wheezing. She had one episode of mild chest pain, which had resolved. Was admitted w/ working dx of asthmatic exacerbation +/- CAP. Treated in usual fashion which included: O2, nebulizers, systemic steroids and antibiotics. As of 9/13, patient with ongoing SOB, DOE, activity intolerance, anxiety, and non-productive cough despite treatments stated above. Triad consulted [facility] for management.   PAST MEDICAL HISTORY :   has a past medical history of Asthma; Hypertension; Sleep apnea; Vocal cord dysfunction; and Anxiety.  has past surgical history that includes Uterine fibroid surgery and Partial hysterectomy. Prior to Admission medications   Medication Sig Start Date End Date Taking? Authorizing Provider  albuterol (PROAIR HFA) 108 (90 BASE) MCG/ACT inhaler Inhale 2 puffs into the lungs every 6 (six) hours as needed for wheezing or shortness of breath.   Yes Historical Provider, MD  albuterol (PROVENTIL) (2.5 MG/3ML) 0.083% nebulizer solution Take 2.5 mg by nebulization every 6 (six) hours as needed for wheezing or shortness of breath.   Yes Historical Provider,  MD  ALPRAZolam (XANAX) 0.25 MG tablet Take 0.25 mg by mouth 3 (three) times daily as needed for anxiety or sleep.    Yes Historical Provider, MD  eszopiclone (LUNESTA) 1 MG TABS tablet Take 1 mg by mouth at bedtime. Take immediately before bedtime   Yes Historical Provider, MD  fexofenadine (ALLEGRA) 180 MG tablet Take 180 mg by mouth daily as needed for allergies or rhinitis.   Yes Historical Provider, MD  Fluticasone-Salmeterol (ADVAIR) 500-50 MCG/DOSE AEPB Inhale 1 puff into the lungs 2 (two) times daily.   Yes Historical Provider, MD  ibuprofen (ADVIL,MOTRIN) 600 MG tablet Take 600 mg by mouth daily as needed for moderate pain.   Yes Historical Provider, MD  meloxicam (MOBIC) 15 MG tablet Take 15 mg by mouth daily as needed.    Yes Historical Provider, MD  Omega-3 Fatty Acids (OMEGA 3 PO) Take 1 tablet by mouth daily.   Yes Historical Provider, MD  pantoprazole (PROTONIX) 40 MG tablet Take 40 mg by mouth daily.   Yes Historical Provider, MD  valsartan-hydrochlorothiazide (DIOVAN-HCT) 160-12.5 MG per tablet Take 1 tablet by mouth daily.   Yes Historical Provider, MD  cefUROXime (CEFTIN) 500 MG tablet Take 1 tablet (500 mg total) by mouth 2 (two) times daily with a meal. 05/06/15   Venetia Maxon Rama, MD   Allergies  Allergen Reactions  . Reglan [Metoclopramide] Other (See Comments)    Made her aggressive, made her feel crazy  . Prednisone Anxiety    Jittery    FAMILY HISTORY:  family history includes Cancer - Lung in her mother; Hypertension in her father and sister. SOCIAL HISTORY:  reports  that she quit smoking about 20 years ago. Her smoking use included Cigarettes. She smoked 0.50 packs per day. She has never used smokeless tobacco. She reports that she drinks alcohol.  REVIEW OF SYSTEMS:   Constitutional: Negative for fever, chills, weight loss, malaise/fatigue and diaphoresis.  HENT: +nasal congestion. Negative for hearing loss, ear pain, nosebleeds, sore throat, neck pain, tinnitus  and ear discharge.   Eyes: Negative for blurred vision, double vision, photophobia, pain, discharge and redness.  Respiratory: +cough, sputum production, shortness of breath, wheezing, hoarseness. Denies hemoptysis Cardiovascular: +chest pain and leg swelling. Negative for palpitations, orthopnea, claudication, and PND.  Gastrointestinal: + Constipation, +nausea. Negative for heartburn, vomiting, abdominal pain, diarrhea, blood in stool and melena.  Genitourinary: Negative for dysuria, urgency, frequency, hematuria and flank pain.  Musculoskeletal: Negative for myalgias, back pain, joint pain and falls.  Skin: Negative for itching and rash.  Neurological: +Dizziness (upon walking), +headache. Negative for tingling, tremors, sensory change, speech change, focal weakness, seizures, loss of consciousness, weakness.   Endo/Heme/Allergies: Negative for environmental allergies and polydipsia. Does not bruise/bleed easily.  SUBJECTIVE: Pt is appropriate, appears anxious. Speaks in full sentences. VITAL SIGNS: Temp:  [98.2 F (36.8 C)-98.9 F (37.2 C)] 98.2 F (36.8 C) (09/13 0601) Pulse Rate:  [60-68] 60 (09/13 0601) Resp:  [18-19] 18 (09/13 0601) BP: (121-130)/(66-71) 121/68 mmHg (09/13 0601) SpO2:  [95 %-99 %] 97 % (09/13 0851)  PHYSICAL EXAMINATION: General:  Awake, alert, appropriate  Neuro:  Awake, A&Ox4  HEENT:  NCAT  Cardiovascular:  RRR, No JVD Lungs:  Lungs clear bilat, mild exp wheeze noted in Rt lung base. No crackles/rales.  Abdomen:  Distended, firm, non-tender.  Musculoskeletal: Intact Skin:  Intact   Recent Labs Lab 04/30/15 2101  NA 138  K 3.7  CL 104  CO2 22  BUN 13  CREATININE 0.91  GLUCOSE 249*    Recent Labs Lab 04/30/15 2101  HGB 12.0  HCT 36.4  WBC 9.4  PLT 251   Dg Abd 1 View  05/06/2015   CLINICAL DATA:  Generalized abdominal pain and nausea today.  EXAM: ABDOMEN - 1 VIEW  COMPARISON:  None.  FINDINGS: The bowel gas pattern is normal. Moderate  stool burden identified. No radio-opaque calculi or other significant radiographic abnormality are seen. Calcifications within the pelvis are likely vascular.  IMPRESSION: Moderate stool burden.   Electronically Signed   By: Nolon Nations M.D.   On: 05/06/2015 21:08    ASSESSMENT / PLAN:  Dyspnea and vocal hoarsness, in the setting of decompensated Vocal Cord Dysfunction.  Probable URI (resolved) +/- asthmatic exacerbation   This is almost always multifactorial, w/ the major contributors being nasal, GI, and psych or some degree of all of them (which she has). Ideally addressing her nasal congestion w/ nasal hygiene such as nasal saline and nasal steroids in addition to decongestants would be step one, however this causes her to be nauseated and vomit, which would be exactly the opposite of what we want to achieve. Key to treatment here is to limit exacerbating factors such as increased reflux (likely exacerbated by her increased GI distention).   Plan Change protonix to BID Laxative D/c abx (not infected, and not convinced she has or had PNA) Cont BDs  Agree steroids not likely to help and will hurt in this situation Have stopped Cepacol and recommended that she avoid all menthol lozenges  Vocal rest Add flutter Cont antihistamine.    Erick Colace ACNP-BC [place] Pager #  [phone] OR # [phone] if no answer     05/07/2015, 12:47 PM

## 2015-05-07 NOTE — Progress Notes (Signed)
Progress Note   Allison Guerra ZHG:[id number] DOB: 1965-10-03 DOA: 04/30/2015 PCP: PROVIDER NOT IN SYSTEM Dr Donata Clay, Internist [address], [place] [address] [phone]  Brief Narrative:   Allison Guerra is an 48 y.o. female with a PMH of hypertension, asthma, GERD, anxiety, OSA and vocal cord dysfunction was admitted 04/30/15 acute onset of shortness of breath accompanied by cough and per sputum. Chest x-ray showed minimal opacity over both costophrenic angles consistent with atelectasis, but pneumonia could not be excluded.  Assessment/Plan:   Principal Problem:   Acute respiratory failure secondary to asthma exacerbation rule out community-acquired pneumonia/cough/shortness of breath/history of vocal cord dysfunction - Continue nebulized broncho-dilator therapy. Discontinue azithromycin and Rocephin after today's dose. - Solu-Medrol discontinued 05/05/15, unable to tolerate prednisone. - HIV antibody, and strep pneumonia/Legionella antigen negative, influenza panel negative.   - Sputum specimen not representative of lower respiratory secretions.  - Pulmonology consulted for assistance with management given ongoing symptoms/activity intolerance.  Active Problems:   Abdominal distention / constipation - KUB shows moderate stool burden. Bowel regimen initiated.    Dizziness - Not orthostatic.    Bacteremia / Contamination - Likely a contaminant (coagulase-negative staph).    Hypertension - Continue Diovan/HCTZ.    Sleep apnea - Refuses CPAP.    Elevated blood sugar - Hemoglobin A1c 6.3% corresponding to prediabetes. Counseled by diabetes coordinator and dietitian. - Currently being managed with resistant scale SSI and 6 units of meal coverage. CBGs 98-135. - Discontinue SSI/insulin as sugars have normalized off steroids. - We'll set up outpatient nutrition counseling/diabetes education per dietitian recommendations.    Anxiety - Xanax  ordered. Reports improvement in symptoms.    DVT Prophylaxis - Lovenox ordered.  Family Communication: No family at bedside. Disposition Plan: Home when respiratory status improved, likely tomorrow. Code Status:     Code Status Orders        Start     Ordered   05/01/15 0314  Full code   Continuous     05/01/15 0313      IV Access:    Peripheral IV   Procedures and diagnostic studies:   Dg Abd 1 View  05/06/2015   CLINICAL DATA:  Generalized abdominal pain and nausea today.  EXAM: ABDOMEN - 1 VIEW  COMPARISON:  None.  FINDINGS: The bowel gas pattern is normal. Moderate stool burden identified. No radio-opaque calculi or other significant radiographic abnormality are seen. Calcifications within the pelvis are likely vascular.  IMPRESSION: Moderate stool burden.   Electronically Signed   By: Nolon Nations M.D.   On: 05/06/2015 21:08     Medical Consultants:    None.  Anti-Infectives:   Anti-infectives    Start     Dose/Rate Route Frequency Ordered Stop   05/06/15 0000  cefUROXime (CEFTIN) 500 MG tablet     500 mg Oral 2 times daily with meals 05/06/15 1140     05/02/15 2200  azithromycin (ZITHROMAX) tablet 500 mg  Status:  Discontinued     500 mg Oral Daily at bedtime 05/02/15 1045 05/07/15 0733   05/01/15 1300  azithromycin (ZITHROMAX) 500 mg in dextrose 5 % 250 mL IVPB  Status:  Discontinued     500 mg 250 mL/hr over 60 Minutes Intravenous Every 24 hours 05/01/15 1211 05/02/15 1045   05/01/15 1300  cefTRIAXone (ROCEPHIN) 1 g in dextrose 5 % 50 mL IVPB  Status:  Discontinued     1 g 100 mL/hr  over 30 Minutes Intravenous Every 24 hours 05/01/15 1211 05/07/15 1518   04/30/15 2030  cefTRIAXone (ROCEPHIN) 1 g in dextrose 5 % 50 mL IVPB     1 g 100 mL/hr over 30 Minutes Intravenous  Once 04/30/15 2029 04/30/15 2142   04/30/15 2030  azithromycin (ZITHROMAX) 500 mg in dextrose 5 % 250 mL IVPB     500 mg 250 mL/hr over 60 Minutes Intravenous  Once 04/30/15 2029  04/30/15 2255      Subjective:   Allison Guerra continues to reports abdominal distention (KUB done last night shows moderate stool), and ongoing dyspnea with activity tolerance.    Objective:    Filed Vitals:   05/06/15 1327 05/06/15 2029 05/06/15 2042 05/07/15 0601  BP: 130/66 121/71  121/68  Pulse: 65 62  60  Temp: 98.9 F (37.2 C) 98.6 F (37 C)  98.2 F (36.8 C)  TempSrc: Oral Oral  Oral  Resp: 18 18  18   Height:      Weight:      SpO2: 98% 99% 97% 99%    Intake/Output Summary (Last 24 hours) at 05/07/15 0732 Last data filed at 05/07/15 0603  Gross per 24 hour  Intake    720 ml  Output   3675 ml  Net  -2955 ml   Filed Weights   05/01/15 1230 05/05/15 0451  Weight: 114.443 kg (252 lb 4.8 oz) 113.581 kg (250 lb 6.4 oz)    Exam: Gen:  NAD, anxious Cardiovascular:  RRR, No M/R/G Respiratory:  Lungs diminished, coarseness to the upper airway consistent with VCD Gastrointestinal:  Abdomen soft, NT/ND, + BS Extremities:  No C/E/C   Data Reviewed:    Labs: Basic Metabolic Panel:  Recent Labs Lab 04/30/15 2101  NA 138  K 3.7  CL 104  CO2 22  GLUCOSE 249*  BUN 13  CREATININE 0.91  CALCIUM 9.6   GFR Estimated Creatinine Clearance: 91.4 mL/min (by C-G formula based on Cr of 0.91).  CBC:  Recent Labs Lab 04/30/15 2101  WBC 9.4  NEUTROABS 8.4*  HGB 12.0  HCT 36.4  MCV 85.8  PLT 251   CBG:  Recent Labs Lab 05/05/15 2158 05/06/15 0717 05/06/15 1144 05/06/15 1632 05/06/15 2125  GLUCAP 148* 107* 98 114* 135*   Microbiology Recent Results (from the past 240 hour(s))  Culture, blood (routine x 2) Call MD if unable to obtain prior to antibiotics being given     Status: None   Collection Time: 05/01/15  4:51 AM  Result Value Ref Range Status   Specimen Description BLOOD RIGHT HAND  Final   Special Requests BOTTLES DRAWN AEROBIC ONLY 4.5ML  Final   Culture  Setup Time   Final    GRAM POSITIVE COCCI IN CLUSTERS AEROBIC BOTTLE  ONLY CRITICAL RESULT CALLED TO, READ BACK BY AND VERIFIED WITH: L REINERT@[email or web address]  05/02/15 MKELLY    Culture   Final    STAPHYLOCOCCUS SPECIES (COAGULASE NEGATIVE) THE SIGNIFICANCE OF ISOLATING THIS ORGANISM FROM A SINGLE VENIPUNCTURE CANNOT BE PREDICTED WITHOUT FURTHER CLINICAL AND CULTURE CORRELATION. SUSCEPTIBILITIES AVAILABLE ONLY ON REQUEST. Performed at [facility]    Report Status 05/03/2015 FINAL  Final  Culture, sputum-assessment     Status: None   Collection Time: 05/02/15  5:17 AM  Result Value Ref Range Status   Specimen Description SPUTUM  Final   Special Requests Normal  Final   Sputum evaluation   Final    MICROSCOPIC FINDINGS SUGGEST THAT THIS SPECIMEN IS NOT  REPRESENTATIVE OF LOWER RESPIRATORY SECRETIONS. PLEASE RECOLLECT. NOTIFIED [name] RN AT [phone] ON 09.08.16 BY SHUEA    Report Status 05/02/2015 FINAL  Final     Medications:   . azithromycin  500 mg Oral QHS  . cefTRIAXone (ROCEPHIN)  IV  1 g Intravenous Q24H  . dextromethorphan-guaiFENesin  1 tablet Oral BID  . docusate sodium  100 mg Oral BID  . enoxaparin (LOVENOX) injection  40 mg Subcutaneous Q24H  . hydrochlorothiazide  12.5 mg Oral Daily  . insulin aspart  0-20 Units Subcutaneous TID WC  . insulin aspart  0-5 Units Subcutaneous QHS  . insulin aspart  6 Units Subcutaneous TID WC  . insulin glargine  10 Units Subcutaneous QHS  . ipratropium-albuterol  3 mL Nebulization QID  . irbesartan  300 mg Oral Daily  . loratadine  10 mg Oral Daily  . omega-3 acid ethyl esters  1 capsule Oral Daily  . pantoprazole  40 mg Oral Daily  . saccharomyces boulardii  250 mg Oral BID   Continuous Infusions:    Time spent: 25 minutes.   LOS: 7 days   [facility] Pager [phone]. If unable to reach me by pager, please call my cell phone at [phone].  *Please refer to [email or web address], password [email or web address] to get updated schedule on who will round on this patient, as hospitalists switch teams  weekly. If 7PM-7AM, please contact night-coverage at [email or web address], password [email or web address] for any overnight needs.  05/07/2015, 7:32 AM

## 2015-05-07 NOTE — Progress Notes (Signed)
PT is recommending HHPT for pt at DC. This CM met with pt at bedside to offer choice for [facility] services. Pt states she would like to use [facility]. [facility] rep informed of referral. CM will continue to follow. Marney Doctor RN,BSN [phone]

## 2015-05-08 ENCOUNTER — Inpatient Hospital Stay (HOSPITAL_COMMUNITY): Payer: Managed Care, Other (non HMO)

## 2015-05-08 DIAGNOSIS — J383 Other diseases of vocal cords: Secondary | ICD-10-CM

## 2015-05-08 DIAGNOSIS — J45901 Unspecified asthma with (acute) exacerbation: Secondary | ICD-10-CM

## 2015-05-08 DIAGNOSIS — J189 Pneumonia, unspecified organism: Principal | ICD-10-CM

## 2015-05-08 DIAGNOSIS — J9601 Acute respiratory failure with hypoxia: Secondary | ICD-10-CM

## 2015-05-08 MED ORDER — SENNA 8.6 MG PO TABS
1.0000 | ORAL_TABLET | Freq: Every day | ORAL | Status: DC
  Administered 2015-05-08: 8.6 mg via ORAL
  Filled 2015-05-08: qty 1

## 2015-05-08 MED ORDER — HYDROCOD POLST-CPM POLST ER 10-8 MG/5ML PO SUER: 5.0000 mL | Freq: Two times a day (BID) | ORAL | Status: DC | PRN

## 2015-05-08 MED ORDER — PANTOPRAZOLE SODIUM 40 MG PO TBEC: 40.0000 mg | DELAYED_RELEASE_TABLET | Freq: Two times a day (BID) | ORAL | Status: DC

## 2015-05-08 MED ORDER — LACTULOSE 10 GM/15ML PO SOLN
20.0000 g | Freq: Once | ORAL | Status: AC
  Administered 2015-05-08: 20 g via ORAL
  Filled 2015-05-08: qty 30

## 2015-05-08 MED ORDER — SENNA 8.6 MG PO TABS: 1.0000 | ORAL_TABLET | Freq: Every day | ORAL | Status: DC

## 2015-05-08 MED ORDER — MAGNESIUM CITRATE PO SOLN
1.0000 | Freq: Once | ORAL | Status: AC
  Administered 2015-05-08: 0.5 via ORAL
  Filled 2015-05-08: qty 296

## 2015-05-08 MED ORDER — FLEET ENEMA 7-19 GM/118ML RE ENEM
1.0000 | ENEMA | Freq: Once | RECTAL | Status: AC
  Administered 2015-05-08: 1 via RECTAL
  Filled 2015-05-08: qty 1

## 2015-05-08 MED ORDER — CEFUROXIME AXETIL 500 MG PO TABS: 500.0000 mg | ORAL_TABLET | Freq: Two times a day (BID) | ORAL | Status: DC

## 2015-05-08 MED ORDER — LACTULOSE 10 GM/15ML PO SOLN: 20.0000 g | Freq: Once | ORAL | Status: DC

## 2015-05-08 MED ORDER — PANTOPRAZOLE SODIUM 40 MG PO TBEC: 40.0000 mg | DELAYED_RELEASE_TABLET | Freq: Two times a day (BID) | ORAL | Status: AC

## 2015-05-08 NOTE — Discharge Summary (Signed)
Physician Discharge Summary  Nicki Furlan NID:[id number] DOB: Sep 26, 1965 DOA: 04/30/2015  PCP: PROVIDER NOT IN SYSTEM  Admit date: 04/30/2015 Discharge date: 05/08/2015  Time spent: 35 minutes  Recommendations for Outpatient Follow-up:  1. Please follow-up on vocal cord dysfunction and obstructive sleep apnea 2. She was given hospital follow-up with pulmonary medicine 3. Will need outpatient sleep study  Discharge Diagnoses:  Principal Problem:   Acute respiratory failure Active Problems:   CAP (community acquired pneumonia)   Asthma exacerbation   Hypertension   Sleep apnea   SOB (shortness of breath)   Cough   Vocal cord dysfunction   Constipation   Elevated blood sugar   Anxiety   Bacteremia   Prediabetes   Discharge Condition: Stable  Diet recommendation: Heart healthy  Filed Weights   05/01/15 1230 05/05/15 0451  Weight: 114.443 kg (252 lb 4.8 oz) 113.581 kg (250 lb 6.4 oz)    History of present illness:  Allison Guerra is a 49 y.o. female with PMH of hypertension, asthma, GERD, anxiety, OSA, vocal cord dysfunction, who presents with the shortness of breath and productive cough.  Patient reports that she started having worsening shortness of breath at about 3 PM today. She coughs up yellow colored sputum in the past several days. She had one episode of mild chest pain, which has resolved. Currently not any chest pain. She has subjective fever, but no chills. She has nausea, but no abdominal pain, diarrhea. She is constipated. She reports that she had 2 hour traveling by car to [facility]. No tenderness over calf areas.   In ED, patient was found to have WBC 9.4, blood sugar 249 on BMP, temperature normal, tachycardia, electrolytes okay. Chest x-ray showed minimal opacity over both costophrenic angles which appears most consistent with atelectasis, but pneumonia particularly on the left difficult to exclude.  Hospital Course:  Patient is a pleasant  49 year old female with past medical history of vocal cord dysfunction, sleep apnea, morbid obesity, admitted to the medicine service on 04/30/2015 when she presented with complaints of cough, shortness of breath, wheezing. Initial workup included a chest x-ray that showed minimal opacity over both costophrenic angles appear to be more consistent with atelectasis. There is also question of developing pneumonia on the left. She was started on empiric IV antibiotic therapy along with beta agonist therapy and systemic steroids in the context of an acute asthmatic exacerbation. During this hospitalization she was seen and evaluated by pulmonary critical care medicine. Felt that gastroesophageal reflux disease likely contributing to symptoms along with untreated obstructive sleep apnea. Patient will require sleep study in the outpatient setting and likely CPAP. She showed gradual clinical improvement. She was discharged to her home in stable condition on 05/08/2015 with instructions to follow-up with pulmonary critical care medicine in the outpatient setting.   Consultations:  Pulmonary critical care medicine  Discharge Exam: Filed Vitals:   05/08/15 0543  BP: 115/62  Pulse: 62  Temp: 98.3 F (36.8 C)  Resp: 16    General: Patient is awake and alert, mentating well, no acute distress Cardiovascular: Regular rate and rhythm normal S1-S2 Respiratory: She does not have expiratory wheezing on lung exam, there was good air movement. She had some upper respiratory wheezing Abdomen: Soft nontender nondistended  Discharge Instructions   Discharge Instructions    Call MD for:  difficulty breathing, headache or visual disturbances    Complete by:  As directed      Call MD for:  extreme fatigue  Complete by:  As directed      Call MD for:  hives    Complete by:  As directed      Call MD for:  persistant dizziness or light-headedness    Complete by:  As directed      Call MD for:  persistant  nausea and vomiting    Complete by:  As directed      Call MD for:  redness, tenderness, or signs of infection (pain, swelling, redness, odor or green/yellow discharge around incision site)    Complete by:  As directed      Call MD for:  severe uncontrolled pain    Complete by:  As directed      Call MD for:  temperature >100.4    Complete by:  As directed      Call MD for:    Complete by:  As directed      Diet - low sodium heart healthy    Complete by:  As directed      Face-to-face encounter (required for Medicare/Medicaid patients)    Complete by:  As directed   I RAMA,CHRISTINA certify that this patient is under my care and that I, or a nurse practitioner or physician's assistant working with me, had a face-to-face encounter that meets the physician face-to-face encounter requirements with this patient on 05/07/2015. The encounter with the patient was in whole, or in part for the following medical condition(s) which is the primary reason for home health care (List medical condition): Asthma flare, VCD causing significant subjective dyspnea, high risk for re-hospitalization secondary to anxiety over symptoms.  The encounter with the patient was in whole, or in part, for the following medical condition, which is the primary reason for home health care:  Asthma exacerbation, VCD  I certify that, based on my findings, the following services are medically necessary home health services:   Nursing Physical therapy    Reason for Medically Necessary Home Health Services:   Skilled Nursing- Skilled Assessment/Observation Skilled Nursing- Teaching of Disease Process/Symptom Management Therapy- Home Adaptation to Facilitate Safety Therapy- Therapeutic Exercises to Increase Strength and Endurance    My clinical findings support the need for the above services:  Shortness of breath with activity  Further, I certify that my clinical findings support that this patient is homebound due to:  Shortness of  Breath with activity     Increase activity slowly    Complete by:  As directed           Current Discharge Medication List    START taking these medications   Details  cefUROXime (CEFTIN) 500 MG tablet Take 1 tablet (500 mg total) by mouth 2 (two) times daily with a meal. Qty: 5 tablet, Refills: 0    chlorpheniramine-HYDROcodone (TUSSIONEX) 10-8 MG/5ML SUER Take 5 mLs by mouth every 12 (twelve) hours as needed for cough. Qty: 140 mL, Refills: 0    lactulose (CHRONULAC) 10 GM/15ML solution Take 30 mLs (20 g total) by mouth once. Qty: 240 mL, Refills: 0    senna (SENOKOT) 8.6 MG TABS tablet Take 1 tablet (8.6 mg total) by mouth daily. Qty: 120 each, Refills: 0      CONTINUE these medications which have CHANGED   Details  pantoprazole (PROTONIX) 40 MG tablet Take 1 tablet (40 mg total) by mouth 2 (two) times daily. Qty: 60 tablet, Refills: 1      CONTINUE these medications which have NOT CHANGED   Details  albuterol (PROAIR HFA)  108 (90 BASE) MCG/ACT inhaler Inhale 2 puffs into the lungs every 6 (six) hours as needed for wheezing or shortness of breath.    albuterol (PROVENTIL) (2.5 MG/3ML) 0.083% nebulizer solution Take 2.5 mg by nebulization every 6 (six) hours as needed for wheezing or shortness of breath.    ALPRAZolam (XANAX) 0.25 MG tablet Take 0.25 mg by mouth 3 (three) times daily as needed for anxiety or sleep.     eszopiclone (LUNESTA) 1 MG TABS tablet Take 1 mg by mouth at bedtime. Take immediately before bedtime    fexofenadine (ALLEGRA) 180 MG tablet Take 180 mg by mouth daily as needed for allergies or rhinitis.    Fluticasone-Salmeterol (ADVAIR) 500-50 MCG/DOSE AEPB Inhale 1 puff into the lungs 2 (two) times daily.    ibuprofen (ADVIL,MOTRIN) 600 MG tablet Take 600 mg by mouth daily as needed for moderate pain.    meloxicam (MOBIC) 15 MG tablet Take 15 mg by mouth daily as needed.     Omega-3 Fatty Acids (OMEGA 3 PO) Take 1 tablet by mouth daily.     valsartan-hydrochlorothiazide (DIOVAN-HCT) 160-12.5 MG per tablet Take 1 tablet by mouth daily.       Allergies  Allergen Reactions  . Reglan [Metoclopramide] Other (See Comments)    Made her aggressive, made her feel crazy  . Prednisone Anxiety    Jittery   Follow-up Information    Follow up with Mauricio Po, FNP On 05/14/2015.   Specialty:  Family Medicine   Why:  Appointment at  2:00 PM.  Please call if you can not keep appointment a day ahead.    Contact information:   [place] [address] [phone]       Follow up with [facility].   Contact information:   [address] [place] [address] [phone]        The results of significant diagnostics from this hospitalization (including imaging, microbiology, ancillary and laboratory) are listed below for reference.    Significant Diagnostic Studies: Dg Abd 1 View  05/06/2015   CLINICAL DATA:  Generalized abdominal pain and nausea today.  EXAM: ABDOMEN - 1 VIEW  COMPARISON:  None.  FINDINGS: The bowel gas pattern is normal. Moderate stool burden identified. No radio-opaque calculi or other significant radiographic abnormality are seen. Calcifications within the pelvis are likely vascular.  IMPRESSION: Moderate stool burden.   Electronically Signed   By: Nolon Nations M.D.   On: 05/06/2015 21:08   Dg Chest Port 1 View  04/30/2015   CLINICAL DATA:  Productive cough, pain under both breasts since this morning with shortness of breath  EXAM: PORTABLE CHEST - 1 VIEW  COMPARISON:  None.  FINDINGS: Heart size and vascular pattern normal. Minimal density over both costophrenic angles. Lungs otherwise clear.  IMPRESSION: Minimal opacity over both costophrenic angles which appears most consistent with atelectasis. Developing pneumonia particularly on the left difficult to exclude however.   Electronically Signed   By: Skipper Cliche M.D.   On: 04/30/2015 18:38   Dg Chest Port 1  View  04/24/2015   CLINICAL DATA:  Asthma, hypertension, sleep apnea.  EXAM: PORTABLE CHEST - 1 VIEW  COMPARISON:  None.  FINDINGS: The heart size and mediastinal contours are within normal limits. Both lungs are clear. The visualized skeletal structures are unremarkable.  IMPRESSION: No active disease.   Electronically Signed   By: Nolon Nations M.D.   On: 04/24/2015 16:25    Microbiology: Recent  Results (from the past 240 hour(s))  Culture, blood (routine x 2) Call MD if unable to obtain prior to antibiotics being given     Status: None   Collection Time: 05/01/15  4:51 AM  Result Value Ref Range Status   Specimen Description BLOOD RIGHT HAND  Final   Special Requests BOTTLES DRAWN AEROBIC ONLY 4.5ML  Final   Culture  Setup Time   Final    GRAM POSITIVE COCCI IN CLUSTERS AEROBIC BOTTLE ONLY CRITICAL RESULT CALLED TO, READ BACK BY AND VERIFIED WITH: L [email or web address]@[email or web address]  05/02/15 MKELLY    Culture   Final    STAPHYLOCOCCUS SPECIES (COAGULASE NEGATIVE) THE SIGNIFICANCE OF ISOLATING THIS ORGANISM FROM A SINGLE VENIPUNCTURE CANNOT BE PREDICTED WITHOUT FURTHER CLINICAL AND CULTURE CORRELATION. SUSCEPTIBILITIES AVAILABLE ONLY ON REQUEST. Performed at [facility]    Report Status 05/03/2015 FINAL  Final  Culture, sputum-assessment     Status: None   Collection Time: 05/02/15  5:17 AM  Result Value Ref Range Status   Specimen Description SPUTUM  Final   Special Requests Normal  Final   Sputum evaluation   Final    MICROSCOPIC FINDINGS SUGGEST THAT THIS SPECIMEN IS NOT REPRESENTATIVE OF LOWER RESPIRATORY SECRETIONS. PLEASE RECOLLECT. NOTIFIED [name] RN AT [phone] ON 09.08.16 BY SHUEA    Report Status 05/02/2015 FINAL  Final     Labs: Basic Metabolic Panel: No results for input(s): NA, K, CL, CO2, GLUCOSE, BUN, CREATININE, CALCIUM, MG, PHOS in the last 168 hours. Liver Function Tests: No results for input(s): AST, ALT, ALKPHOS, BILITOT, PROT, ALBUMIN in the last 168 hours. No  results for input(s): LIPASE, AMYLASE in the last 168 hours. No results for input(s): AMMONIA in the last 168 hours. CBC: No results for input(s): WBC, NEUTROABS, HGB, HCT, MCV, PLT in the last 168 hours. Cardiac Enzymes: No results for input(s): CKTOTAL, CKMB, CKMBINDEX, TROPONINI in the last 168 hours. BNP: BNP (last 3 results) No results for input(s): BNP in the last 8760 hours.  ProBNP (last 3 results) No results for input(s): PROBNP in the last 8760 hours.  CBG:  Recent Labs Lab 05/06/15 0717 05/06/15 1144 05/06/15 1632 05/06/15 2125 05/07/15 0738  GLUCAP 107* 98 114* 135* 101*       Signed:  Vinny Taranto  [facility] 05/08/2015, 10:15 AM

## 2015-05-08 NOTE — Progress Notes (Signed)
Physical Therapy Treatment Patient Details Name: Allison Guerra MRN: [id number] DOB: 12-Mar-1966 Today's Date: 05/19/2015    History of Present Illness 49 y.o. female with a PMH of hypertension, asthma, GERD, anxiety, OSA and vocal cord dysfunction was admitted 04/30/15 for acute respiratory failure secondary to asthma exacerbation rule out community-acquired pneumonia/cough/shortness of breath    PT Comments    Pt ambulated in hallway taking occasional short rest standing breaks.  Pt denies dizziness today however reports slight SOB.  Pt very eager to mobilize due to likely d/c home today.    Follow Up Recommendations  Home health PT     Equipment Recommendations  None recommended by PT    Recommendations for Other Services       Precautions / Restrictions Precautions Precautions: Fall    Mobility  Bed Mobility Overal bed mobility: Modified Independent                Transfers Overall transfer level: Needs assistance Equipment used: None   Sit to Stand: Supervision         General transfer comment: denies dizziness  Ambulation/Gait Ambulation/Gait assistance: Min guard Ambulation Distance (Feet): 120 Feet Assistive device: None Gait Pattern/deviations: Step-through pattern;Decreased stride length     General Gait Details: slow pace with occasional rest break due to SOB, ambulated near hand railing in case of need for UE support however pt did not use upon returning to room, pt declined using SPC or RW   Stairs            Wheelchair Mobility    Modified Rankin (Stroke Patients Only)       Balance                                    Cognition Arousal/Alertness: Awake/alert Behavior During Therapy: WFL for tasks assessed/performed Overall Cognitive Status: Within Functional Limits for tasks assessed                      Exercises      General Comments        Pertinent Vitals/Pain Pain Assessment: No/denies pain     Home Living                      Prior Function            PT Goals (current goals can now be found in the care plan section) Progress towards PT goals: Progressing toward goals    Frequency  Min 3X/week    PT Plan Current plan remains appropriate    Co-evaluation             End of Session Equipment Utilized During Treatment: Gait belt Activity Tolerance: Patient limited by fatigue Patient left: in bed;with call bell/phone within reach     Time: 1024-1035 PT Time Calculation (min) (ACUTE ONLY): 11 min  Charges:  $Gait Training: 8-22 mins                    G Codes:      Magnum Lunde,KATHrine E May 19, 2015, 12:09 PM Carmelia Bake, PT, DPT 05/19/2015 Pager: [phone]

## 2015-05-09 NOTE — Progress Notes (Signed)
Patient discharge at 2040. Discharge instruction went over with patient and copy given to patient. Patient given prescription and transported by wheelchair (NT) to front of the building to me with security.

## 2015-05-14 ENCOUNTER — Ambulatory Visit: Payer: Managed Care, Other (non HMO) | Admitting: Family

## 2015-05-15 ENCOUNTER — Inpatient Hospital Stay: Payer: Managed Care, Other (non HMO) | Admitting: Internal Medicine

## 2015-05-17 ENCOUNTER — Ambulatory Visit (INDEPENDENT_AMBULATORY_CARE_PROVIDER_SITE_OTHER): Payer: Managed Care, Other (non HMO) | Admitting: Internal Medicine

## 2015-05-17 ENCOUNTER — Encounter: Payer: Self-pay | Admitting: Internal Medicine

## 2015-05-17 ENCOUNTER — Ambulatory Visit (INDEPENDENT_AMBULATORY_CARE_PROVIDER_SITE_OTHER)
Admission: RE | Admit: 2015-05-17 | Discharge: 2015-05-17 | Disposition: A | Discharge status: Home / Self Care | Payer: Managed Care, Other (non HMO) | Source: Ambulatory Visit | Attending: Internal Medicine | Admitting: Internal Medicine

## 2015-05-17 VITALS — BP 134/82 | HR 90 | Ht 64.0 in | Wt 243.0 lb

## 2015-05-17 DIAGNOSIS — R05 Cough: Secondary | ICD-10-CM | POA: Diagnosis not present

## 2015-05-17 DIAGNOSIS — R058 Other specified cough: Secondary | ICD-10-CM

## 2015-05-17 DIAGNOSIS — G4730 Sleep apnea, unspecified: Secondary | ICD-10-CM | POA: Diagnosis not present

## 2015-05-17 DIAGNOSIS — J189 Pneumonia, unspecified organism: Secondary | ICD-10-CM

## 2015-05-17 MED ORDER — MOMETASONE FURO-FORMOTEROL FUM 100-5 MCG/ACT IN AERO
INHALATION_SPRAY | RESPIRATORY_TRACT | Status: DC
Start: 2015-05-17 — End: 2017-06-16

## 2015-05-17 NOTE — Progress Notes (Signed)
Subjective:     Patient ID: Allison Guerra, female   DOB: [birth year]   MRN: [id number]  HPI   88 yobf quit smoking 1988 about the time she learned she was pregnant  and no trouble at all with breathing until mid 90's dx as asthma on prn saba started Advair in early 2000s  But freq flares and referred to pulmonary clinic 05/17/2015 p most recent admit:   Admit date: 04/30/2015 Discharge date: 05/08/2015   Recommendations for Outpatient Follow-up:  1. Please follow-up on vocal cord dysfunction and obstructive sleep apnea 2. She was given hospital follow-up with pulmonary medicine 3. Will need outpatient sleep study  Discharge Diagnoses:  Principal Problem:  Acute respiratory failure Active Problems:  CAP (community acquired pneumonia)  Asthma exacerbation  Hypertension  Sleep apnea  SOB (shortness of breath)  Cough  Vocal cord dysfunction  Constipation  Elevated blood sugar  Anxiety  Bacteremia  Prediabetes   Discharge Condition: Stable  Diet recommendation: Heart healthy  Filed Weights   05/01/15 1230 05/05/15 0451  Weight: 114.443 kg (252 lb 4.8 oz) 113.581 kg (250 lb 6.4 oz)    History of present illness:  Allison Guerra is a 49 y.o. female with PMH of hypertension, asthma, GERD, anxiety, OSA, vocal cord dysfunction, who presents with the shortness of breath and productive cough.  Patient reports that she started having worsening shortness of breath at about 3 PM today. She coughs up yellow colored sputum in the past several days. She had one episode of mild chest pain, which has resolved. Currently not any chest pain. She has subjective fever, but no chills. She has nausea, but no abdominal pain, diarrhea. She is constipated. She reports that she had 2 hour traveling by car to [facility]. No tenderness over calf areas.   In ED, patient was found to have WBC 9.4, blood sugar 249 on BMP, temperature normal, tachycardia, electrolytes  okay. Chest x-ray showed minimal opacity over both costophrenic angles which appears most consistent with atelectasis, but pneumonia particularly on the left difficult to exclude.  Hospital Course:  Patient is a pleasant 49 year old female with past medical history of vocal cord dysfunction, sleep apnea, morbid obesity, admitted to the medicine service on 04/30/2015 when she presented with complaints of cough, shortness of breath, wheezing. Initial workup included a chest x-ray that showed minimal opacity over both costophrenic angles appear to be more consistent with atelectasis. There is also question of developing pneumonia on the left. She was started on empiric IV antibiotic therapy along with beta agonist therapy and systemic steroids in the context of an acute asthmatic exacerbation. During this hospitalization she was seen and evaluated by pulmonary critical care medicine. Felt that gastroesophageal reflux disease likely contributing to symptoms along with untreated obstructive sleep apnea. Patient will require sleep study in the outpatient setting and likely CPAP. She showed gradual clinical improvement. She was discharged to her home in stable condition on 05/08/2015 with instructions to follow-up with pulmonary critical care medicine in the outpatient setting.        05/17/2015 1st [facility] office visit/post hosp f/u /  Melvyn Novas  On advair 500  Chief Complaint  Patient presents with  . HFU    Pt states that her breathing has improved some since hospital d/c.   still sob with more than mid adls with audible insp wheeze heard across the exam room and dry harsh coughing fits , more day than noct  No obvious day to day or daytime  variability or assoc chronic cough or cp or chest tightness, subjective wheeze or overt sinus or hb symptoms. No unusual exp hx or h/o childhood pna/ asthma or knowledge of premature birth.  Sleeping ok without nocturnal  or early am exacerbation  of respiratory   c/o's or need for noct saba. Also denies any obvious fluctuation of symptoms with weather or environmental changes or other aggravating or alleviating factors except as outlined above   Current Medications, Allergies, Complete Past Medical History, Past Surgical History, Family History, and Social History were reviewed in [employer] record.  ROS  The following are not active complaints unless bolded sore throat, dysphagia, dental problems, itching, sneezing,  nasal congestion or excess/ purulent secretions, ear ache,   fever, chills, sweats, unintended wt loss, classically pleuritic or exertional cp, hemoptysis,  orthopnea pnd or leg swelling, presyncope, palpitations, abdominal pain, anorexia, nausea, vomiting, diarrhea  or change in bowel or bladder habits, change in stools or urine, dysuria,hematuria,  rash, arthralgias, visual complaints, headache, numbness, weakness or ataxia or problems with walking or coordination,  change in mood/affect or memory.          Review of Systems     Objective:   Physical Exam  Obese amb bf with Classic pseudowheeze  Wt Readings from Last 3 Encounters:  05/17/15 243 lb (110.224 kg)  05/05/15 250 lb 6.4 oz (113.581 kg)    Vital signs reviewed   HEENT: nl dentition, turbinates, and orophanx. Nl external ear canals without cough reflex   NECK :  without JVD/Nodes/TM/ nl carotid upstrokes bilaterally   LUNGS: no acc muscle use, clear to A and P bilaterally without cough on insp or exp maneuvers   CV:  RRR  no s3 or murmur or increase in P2, no edema   ABD:  soft and nontender with nl excursion in the supine position. No bruits or organomegaly, bowel sounds nl  MS:  warm without deformities, calf tenderness, cyanosis or clubbing  SKIN: warm and dry without lesions    NEURO:  alert, approp, no deficits     I personally reviewed images and agree with radiology impression as follows:  CXR:  05/17/2015  No active  disease.      Assessment:

## 2015-05-17 NOTE — Patient Instructions (Addendum)
Stop advair and fish oil  Start dulera 100 Take 2 puffs first thing in am and then another 2 puffs about 12 hours later.   Work on inhaler technique:  relax and gently blow all the way out then take a nice smooth deep breath back in, triggering the inhaler at same time you start breathing in.  Hold for up to 5 seconds if you can. Blow out thru nose. Rinse and gargle with water when done     Only use your albuterol as a rescue medication to be used if you can't catch your breath by resting or doing a relaxed purse lip breathing pattern.  - The less you use it, the better it will work when you need it. - Ok to use up to 2 puffs  every 4 hours if you must but call for immediate appointment if use goes up over your usual need - Don't leave home without it !!  (think of it like the spare tire for your car)   For cough > tussionex as needed > transition to delsym   Pantoprazole 40 mg Take 30- 60 min before your first and last meals of the day   GERD (REFLUX)  is an extremely common cause of respiratory symptoms just like yours , many times with no obvious heartburn at all.    It can be treated with medication, but also with lifestyle changes including elevation of the head of your bed (ideally with 6 inch  bed blocks),  Smoking cessation, avoidance of late meals, excessive alcohol, and avoid fatty foods, chocolate, peppermint, colas, red wine, and acidic juices such as orange juice.  NO MINT OR MENTHOL PRODUCTS SO NO COUGH DROPS  USE SUGARLESS CANDY INSTEAD (Jolley ranchers or Stover's or Life Savers) or even ice chips will also do - the key is to swallow to prevent all throat clearing. NO OIL BASED VITAMINS - use powdered substitutes.   Please remember to go to the  x-ray department downstairs for your tests - we will call you with the results when they are available.  Please schedule a follow up office visit in 2 weeks, sooner if needed to See Dr Milinda Hirschfeld or me if he's not available Late add  Needs TSh on f/u

## 2015-05-18 DIAGNOSIS — R058 Other specified cough: Secondary | ICD-10-CM | POA: Insufficient documentation

## 2015-05-18 DIAGNOSIS — R05 Cough: Secondary | ICD-10-CM | POA: Insufficient documentation

## 2015-05-18 NOTE — Assessment & Plan Note (Signed)
cxr clear 05/17/2015 no further f/u planned

## 2015-05-18 NOTE — Assessment & Plan Note (Addendum)
Classic Upper airway cough syndrome, so named because it's frequently impossible to sort out how much is  CR/sinusitis with freq throat clearing (which can be related to primary GERD)   vs  causing  secondary (" extra esophageal")  GERD from wide swings in gastric pressure that occur with throat clearing, often  promoting self use of mint and menthol lozenges that reduce the lower esophageal sphincter tone and exacerbate the problem further in a cyclical fashion.   These are the same pts (now being labeled as having "irritable larynx syndrome" by some cough centers) who not infrequently have a history of having failed to tolerate ace inhibitors,  dry powder inhalers (esp advair, esp high dose which applies here) or biphosphonates or report having atypical reflux symptoms that don't respond to standard doses of PPI , and are easily confused as having aecopd or asthma flares by even experienced allergists/ pulmonologists.   The proper method of use, as well as anticipated side effects, of a metered-dose inhaler are discussed and demonstrated to the patient. Improved effectiveness after extensive coaching during this visit to a level of approximately  75% from a baseline of 25%   For now will try on dulera 100 2bid and max rx for gerd/ cyclical cough then regroup in 2 weeks  Total time = 25 m review case with pt/ discussion/ counseling/ giving and going over instructions (see avs)

## 2015-05-18 NOTE — Assessment & Plan Note (Signed)
Body mass index is 41.69 kg/(m^2).  No results found for: TSH   Contributing to gerd tendency/ doe/reviewed the need and the process to achieve and maintain neg calorie balance > defer f/u primary care including intermittently monitoring thyroid status

## 2015-05-18 NOTE — Assessment & Plan Note (Signed)
Sleep study ordered/ wt loss advised  -see obesity

## 2015-05-21 NOTE — Progress Notes (Signed)
Quick Note:  Spoke with pt and notified of results per Dr. Wert. Pt verbalized understanding and denied any questions.  ______ 

## 2015-05-22 ENCOUNTER — Ambulatory Visit: Payer: Managed Care, Other (non HMO) | Admitting: Family

## 2015-05-23 ENCOUNTER — Ambulatory Visit: Payer: Managed Care, Other (non HMO) | Admitting: Family

## 2015-05-29 ENCOUNTER — Telehealth: Payer: Self-pay | Admitting: Internal Medicine

## 2015-05-29 MED ORDER — TRAMADOL HCL 50 MG PO TABS: 100.0000 mg | ORAL_TABLET | ORAL | Status: DC | PRN

## 2015-05-29 NOTE — Telephone Encounter (Signed)
Error.Stanley A Dalton ° °

## 2015-05-29 NOTE — Telephone Encounter (Signed)
Spoke with the pt  She states cough seems worse since the last visit  She is coughing up sputum but does not know the color "b/c I am in the dark" She is out of tussionex, and has not tried delsym b/c "regular cough syrup does not help, it has to have codeine" She has ov 05/31/15 and refuses to come in sooner  Please advise thanks

## 2015-05-29 NOTE — Telephone Encounter (Addendum)
LVM for pt to return call

## 2015-05-29 NOTE — Telephone Encounter (Signed)
We can't Prescribed Tussionex in this setting. The best I could offer is  Take delsym two tsp every 12 hours and supplement if needed with  tramadol 50 mg up to 2 every 4 hours to suppress the urge to cough. Swallowing water or using ice chips/non mint and menthol containing candies (such as lifesavers or sugarless jolly ranchers) are also effective.  You should rest your voice and avoid activities that you know make you cough.  Once you have eliminated the cough for 3 straight days try reducing the tramadol first,  then the delsym as tolerated.    rx tramadol 50 mg #40 no refills  Be sure she brings every medicine that active with her including all inhalers nebulizer solutions etc.

## 2015-05-29 NOTE — Telephone Encounter (Signed)
Pt is aware of MW's recommendations. Rx for Tramadol has been called in. Nothing further was needed at this time.

## 2015-05-31 ENCOUNTER — Ambulatory Visit (INDEPENDENT_AMBULATORY_CARE_PROVIDER_SITE_OTHER): Payer: Managed Care, Other (non HMO) | Admitting: Internal Medicine

## 2015-05-31 ENCOUNTER — Encounter: Payer: Self-pay | Admitting: Internal Medicine

## 2015-05-31 VITALS — BP 128/88 | HR 87 | Ht 64.0 in | Wt 246.2 lb

## 2015-05-31 DIAGNOSIS — R05 Cough: Secondary | ICD-10-CM | POA: Diagnosis not present

## 2015-05-31 DIAGNOSIS — R058 Other specified cough: Secondary | ICD-10-CM

## 2015-05-31 MED ORDER — FLUTTER DEVI: Status: AC

## 2015-05-31 NOTE — Patient Instructions (Addendum)
Take delsym two tsp every 12 hours and supplement if needed with  tramadol 50 mg up to 2 every 4 hours to suppress the urge to cough. Swallowing water or using ice chips/non mint and menthol containing candies (such as lifesavers or sugarless jolly ranchers) are also effective.  You should rest your voice and avoid activities that you know make you cough.  Once you have eliminated the cough for 3 straight days try reducing the tramadol first,  then the delsym as tolerated.    Every time you cough please cough into the flutter valve   Pepcid ac 20 mg and chlorpheniramine 4 mg x 2 at bedtime  Protonix 40 mg Take 30-60 min before your first and last meals of the day   Dulera 100 Take 2 puffs first thing in am and then another 2 puffs about 12 hours later.   Only use your albuterol as a rescue medication to be used if you can't catch your breath by resting or doing a relaxed purse lip breathing pattern.  - The less you use it, the better it will work when you need it. - Ok to use up to  every 4 hours if you must but call for immediate appointment if use goes up over your usual need - Don't leave home without it !!  (think of it like the spare tire for your car)   Make appt to see Dr Milinda Hirschfeld in two weeks - bring all active medications and flutter valve with you  Late add : needs tsh on return  ? Allergies > needs allergy profile next ?  ? Active sinus dz > ? Needs sinus CT next

## 2015-05-31 NOTE — Progress Notes (Signed)
Subjective:     Patient ID: Allison Guerra, female   DOB: 09-Feb-1966   MRN: [id number]    Brief patient profile:  34 yobf quit smoking 1988 about the time she learned she was pregnant  and no trouble at all with breathing until mid 90's dx as asthma on prn saba started Advair in early 2000s  But freq flares and referred to pulmonary clinic 05/17/2015 p most recent admit:   Admit date: 04/30/2015 Discharge date: 05/08/2015   Recommendations for Outpatient Follow-up:  1. Please follow-up on vocal cord dysfunction and obstructive sleep apnea 2. She was given hospital follow-up with pulmonary medicine 3. Will need outpatient sleep study  Discharge Diagnoses:  Principal Problem:  Acute respiratory failure   CAP (community acquired pneumonia)  Asthma exacerbation  Hypertension  Sleep apnea  SOB (shortness of breath)  Cough  Vocal cord dysfunction  Constipation  Elevated blood sugar  Anxiety  Bacteremia  Prediabetes  Filed Weights   05/01/15 1230 05/05/15 0451  Weight: 114.443 kg (252 lb 4.8 oz) 113.581 kg (250 lb 6.4 oz)    History of present illness:  Allison Guerra is a 49 y.o. female with PMH of hypertension, asthma, GERD, anxiety, OSA, vocal cord dysfunction, who presents with the shortness of breath and productive cough.  Patient reports that she started having worsening shortness of breath at about 3 PM today. She coughs up yellow colored sputum in the past several days. She had one episode of mild chest pain, which has resolved. Currently not any chest pain. She has subjective fever, but no chills. She has nausea, but no abdominal pain, diarrhea. She is constipated. She reports that she had 2 hour traveling by car to [facility]. No tenderness over calf areas.   In ED, patient was found to have WBC 9.4, blood sugar 249 on BMP, temperature normal, tachycardia, electrolytes okay. Chest x-ray showed minimal opacity over both costophrenic angles  which appears most consistent with atelectasis, but pneumonia particularly on the left difficult to exclude.  Hospital Course:  Patient is a pleasant 49 year old female with past medical history of vocal cord dysfunction, sleep apnea, morbid obesity, admitted to the medicine service on 04/30/2015 when she presented with complaints of cough, shortness of breath, wheezing. Initial workup included a chest x-ray that showed minimal opacity over both costophrenic angles appear to be more consistent with atelectasis. There is also question of developing pneumonia on the left. She was started on empiric IV antibiotic therapy along with beta agonist therapy and systemic steroids in the context of an acute asthmatic exacerbation. During this hospitalization she was seen and evaluated by pulmonary critical care medicine. Felt that gastroesophageal reflux disease likely contributing to symptoms along with untreated obstructive sleep apnea. Patient will require sleep study in the outpatient setting and likely CPAP. She showed gradual clinical improvement. She was discharged to her home in stable condition on 05/08/2015 with instructions to follow-up with pulmonary critical care medicine in the outpatient setting.        05/17/2015 1st [facility] office visit/post hosp f/u /  Allison Guerra  On advair 500  Chief Complaint  Patient presents with  . HFU    Pt states that her breathing has improved some since hospital d/c.   still sob with more than mid adls with audible insp wheeze heard across the exam room and dry harsh coughing fits , more day than noct rec Stop advair and fish oil Start dulera 100 Take 2 puffs first thing in am  and then another 2 puffs about 12 hours later.  Work on inhaler technique:   Only use your albuterol as a rescue medication For cough > tussionex as needed > transition to delsym  Pantoprazole 40 mg Take 30- 60 min before your first and last meals of the day  GERD diet  Please  schedule a follow up office visit in 2 weeks, sooner if needed to See Allison Guerra or me if he's not available    05/31/2015  f/u ov/Allison Guerra re: uacs/ requesting more tussionex "only thing that worksAdvertising account planner Complaint  Patient presents with  . Follow-up    UACS. Pt c/o of chronic mostly wet cough with white/yellow/green mucus. Pt states that her cough is worse at night when sleeping. Pt also c/o of SOB/wheeze/chest tightness. Pt uses her albuterol inhaler and/or nebulizer 2 times a day. Pt also c/o of increased dizziness immediately after coughing.    coughing so severely has presyncope/ gagging/wheezing Offered tramadol but has not picked it up   No obvious day to day or daytime variability or assoc  cp  or overt sinus or hb symptoms. No unusual exp hx or h/o childhood pna/ asthma or knowledge of premature birth.   Also denies any obvious fluctuation of symptoms with weather or environmental changes or other aggravating or alleviating factors except as outlined above   Current Medications, Allergies, Complete Past Medical History, Past Surgical History, Family History, and Social History were reviewed in [employer] record.  ROS  The following are not active complaints unless bolded sore throat, dysphagia, dental problems, itching, sneezing,  nasal congestion or excess/ purulent secretions, ear ache,   fever, chills, sweats, unintended wt loss, classically pleuritic or exertional cp, hemoptysis,  orthopnea pnd or leg swelling, presyncope, palpitations, abdominal pain, anorexia, nausea, vomiting, diarrhea  or change in bowel or bladder habits, change in stools or urine, dysuria,hematuria,  rash, arthralgias, visual complaints, headache, numbness, weakness or ataxia or problems with walking or coordination,  change in mood/affect or memory.               Objective:   Physical Exam  Obese amb bf with Classic pseudowheeze  05/31/2015        246  Wt Readings from Last 3  Encounters:  05/17/15 243 lb (110.224 kg)  05/05/15 250 lb 6.4 oz (113.581 kg)    Vital signs reviewed   HEENT: nl dentition, turbinates, and orophanx. Nl external ear canals without cough reflex   NECK :  without JVD/Nodes/TM/ nl carotid upstrokes bilaterally   LUNGS: no acc muscle use, clear to A and P bilaterally without cough on insp or exp maneuvers   CV:  RRR  no s3 or murmur or increase in P2, no edema   ABD:  soft and nontender with nl excursion in the supine position. No bruits or organomegaly, bowel sounds nl  MS:  warm without deformities, calf tenderness, cyanosis or clubbing  SKIN: warm and dry without lesions    NEURO:  alert, approp, no deficits     I personally reviewed images and agree with radiology impression as follows:  CXR:  05/17/2015  No active disease.      Assessment:

## 2015-06-01 ENCOUNTER — Encounter: Payer: Self-pay | Admitting: Internal Medicine

## 2015-06-01 NOTE — Assessment & Plan Note (Addendum)
This problem was not well controlled at all and so severe she is having pre-syncope.  DDX of  difficult airways management all start with A and  include Adherence, Ace Inhibitors, Acid Reflux, Active Sinus Disease, Alpha 1 Antitripsin deficiency, Anxiety masquerading as Airways dz,  ABPA,  allergy(esp in young), Aspiration (esp in elderly), Adverse effects of meds,  Active smokers, A bunch of PE's (a small clot burden can't cause this syndrome unless there is already severe underlying pulm or vascular dz with poor reserve) plus two Bs  = Bronchiectasis and Beta blocker use..and one C= CHF  Adherence is always the initial "prime suspect" and is a multilayered concern that requires a "trust but verify" approach in every patient - starting with knowing how to use medications, especially inhalers, correctly, keeping up with refills and understanding the fundamental difference between maintenance and prns vs those medications only taken for a very short course and then stopped and not refilled.  - not clear at all she's doing what we've already asked her to do and explained  it makes no sense to ask her to do more at this point - The proper method of use, as well as anticipated side effects, of a metered-dose inhaler are discussed and demonstrated to the patient. Improved effectiveness after extensive coaching during this visit to a level of approximately  75% from a baseline of 25% so needs to keep working  ? Acid (or non-acid) GERD > always difficult to exclude as up to 75% of pts in some series report no assoc GI/ Heartburn symptoms> rec max (24h)  acid suppression and diet restrictions/ reviewed and instructions given in writing.   ? Anxiety > usually at the bottom of this list of usual suspects but should be much higher on this pt's based on H and P and note already on psychotropics .   ? Allergies > needs allergy profile next ?   ? Active sinus dz > ? Needs sinus CT next   I had an extended discussion  with the patient reviewing all relevant studies completed to date and  lasting 15 to 20 minutes of a 25 minute visit    Each maintenance medication was reviewed in detail including most importantly the difference between maintenance and prns and under what circumstances the prns are to be triggered using an action plan format that is not reflected in the computer generated alphabetically organized AVS.    Please see instructions for details which were reviewed in writing and the patient given a copy highlighting the part that I personally wrote and discussed at today's ov.

## 2015-06-01 NOTE — Assessment & Plan Note (Signed)
Body mass index is 42.24  > still trending up No results found for: TSH > needs to be repeated at next ov   Contributing to gerd tendency/ doe/reviewed the need and the process to achieve and maintain neg calorie balance > defer f/u primary care including intermittently monitoring thyroid status

## 2015-06-03 ENCOUNTER — Telehealth: Payer: Self-pay | Admitting: Internal Medicine

## 2015-06-03 ENCOUNTER — Telehealth: Payer: Self-pay | Admitting: *Deleted

## 2015-06-03 MED ORDER — PROMETHAZINE-CODEINE 6.25-10 MG/5ML PO SYRP: 10.0000 mL | ORAL_SOLUTION | ORAL | Status: DC | PRN

## 2015-06-03 NOTE — Telephone Encounter (Signed)
We can do phenergan with codeine # 8oz 2 tsp every 4 h prn    She's not my pt anyway > she's dr Ammie Dalton- no need to change doctors

## 2015-06-03 NOTE — Telephone Encounter (Signed)
Noted! Thank you

## 2015-06-03 NOTE — Telephone Encounter (Signed)
Just FYI: Pt called back to cancel appointment. She thinks she may need to go to ER. She will call back to reschedule.

## 2015-06-03 NOTE — Telephone Encounter (Signed)
Called spoke with pt. She reports her cough is much worse now. She can't stop coughing. She is having a lot of pain in chest/back.  She reports she asked to be given cough syrup to help her stop coughing and reports this was not done. She reports MW advised he would have.  She reports the tramadol does not help at all. She did not have any money to buy delsym cough syrup.  She is requesting an RX for cough syrup. She reports the hospital gave her tussin w/ codeine and her insurance paid for this. She reports "I need cough syrup bc I can't afford to buy OTC cough syrup before I end up in the hospital".  Also pt is requesting to see another doctor int he practice She reports she is not pleased with him and does not feel he can help her and feels she is worse off.  Please advise MW thanks

## 2015-06-03 NOTE — Telephone Encounter (Signed)
Called and spoke to pt. Informed her of the recs per MW. Rx called into preferred pharmacy. Advised pt we need to get an appt with JN or TP. Appt made with TP on 10.18.2016. Pt verbalized understanding and denied any further questions or concerns at this time.

## 2015-06-03 NOTE — Telephone Encounter (Signed)
Pt has a new patient appt with you tomorrow and she wanted me to let you know that she is feeling really bad. She has asthma and is coughing really bad. I did let her know that if she gets too bad to go to ER.

## 2015-06-04 ENCOUNTER — Ambulatory Visit: Payer: Managed Care, Other (non HMO) | Admitting: Family

## 2015-06-11 ENCOUNTER — Ambulatory Visit: Payer: Managed Care, Other (non HMO) | Admitting: Adult Health

## 2015-06-12 ENCOUNTER — Ambulatory Visit: Payer: Managed Care, Other (non HMO) | Admitting: Internal Medicine

## 2015-06-18 ENCOUNTER — Ambulatory Visit: Payer: Managed Care, Other (non HMO) | Admitting: Adult Health

## 2015-07-22 ENCOUNTER — Other Ambulatory Visit: Payer: Self-pay | Admitting: Family Medicine

## 2015-07-22 ENCOUNTER — Other Ambulatory Visit (HOSPITAL_COMMUNITY)
Admission: RE | Admit: 2015-07-22 | Discharge: 2015-07-22 | Disposition: A | Discharge status: Home / Self Care | Payer: Managed Care, Other (non HMO) | Source: Ambulatory Visit | Attending: Family Medicine | Admitting: Family Medicine

## 2015-07-22 ENCOUNTER — Other Ambulatory Visit: Payer: Self-pay

## 2015-07-22 DIAGNOSIS — Z1231 Encounter for screening mammogram for malignant neoplasm of breast: Secondary | ICD-10-CM

## 2015-07-22 DIAGNOSIS — Z1151 Encounter for screening for human papillomavirus (HPV): Secondary | ICD-10-CM | POA: Insufficient documentation

## 2015-07-22 DIAGNOSIS — Z01411 Encounter for gynecological examination (general) (routine) with abnormal findings: Secondary | ICD-10-CM | POA: Diagnosis present

## 2015-07-23 LAB — CYTOLOGY - PAP

## 2015-08-07 ENCOUNTER — Ambulatory Visit
Admission: RE | Admit: 2015-08-07 | Discharge: 2015-08-07 | Disposition: A | Discharge status: Home / Self Care | Payer: Managed Care, Other (non HMO) | Source: Ambulatory Visit

## 2015-08-07 DIAGNOSIS — Z1231 Encounter for screening mammogram for malignant neoplasm of breast: Secondary | ICD-10-CM

## 2015-08-09 ENCOUNTER — Telehealth: Payer: Self-pay | Admitting: Pulmonary Disease

## 2015-08-09 MED ORDER — PREDNISONE 10 MG PO TABS: ORAL_TABLET | ORAL | Status: DC

## 2015-08-09 MED ORDER — PROMETHAZINE-CODEINE 6.25-10 MG/5ML PO SYRP: 5.0000 mL | ORAL_SOLUTION | Freq: Four times a day (QID) | ORAL | Status: DC | PRN

## 2015-08-09 NOTE — Telephone Encounter (Signed)
lmtcb X1 for pt to relay below recs.   Med called in to below verified pharmacy d/t being so late in the day on a Friday.   wcb to schedule ov.

## 2015-08-09 NOTE — Telephone Encounter (Signed)
Prednisone 10 mg take  4 each am x 2 days,   2 each am x 2 days,  1 each am x 2 days and stop  Phenergan vc with codiene # 8 oz Missed last appt with TP so no further phone med, needs ov asap with Tammy or Nestor with all meds in hand

## 2015-08-09 NOTE — Telephone Encounter (Signed)
Pt c/o hoarseness, "vocal cord pain", difficulty swallowing, sore throat, pnd, runny nose, cough worse qhs X3 weeks. Taking  pantoprozole, cough syrup with codeine (given by pcp)-pt is now out.  Pt uses [facility] on [address]    MW please advise on recs.  Thanks!

## 2015-08-12 NOTE — Telephone Encounter (Signed)
Pt scheduled for OV with TP 08/15/15 at 10:30 Pt has picked up meds and has started taking these - will call if her symptoms worsen before her appt. Nothing further needed.

## 2015-08-15 ENCOUNTER — Encounter: Payer: Self-pay | Admitting: Adult Health

## 2015-08-15 ENCOUNTER — Ambulatory Visit (INDEPENDENT_AMBULATORY_CARE_PROVIDER_SITE_OTHER): Payer: Managed Care, Other (non HMO) | Admitting: Adult Health

## 2015-08-15 VITALS — BP 112/74 | HR 88 | Temp 98.7°F | Ht 63.0 in | Wt 249.0 lb

## 2015-08-15 DIAGNOSIS — J383 Other diseases of vocal cords: Secondary | ICD-10-CM

## 2015-08-15 DIAGNOSIS — G4730 Sleep apnea, unspecified: Secondary | ICD-10-CM | POA: Diagnosis not present

## 2015-08-15 DIAGNOSIS — R058 Other specified cough: Secondary | ICD-10-CM

## 2015-08-15 DIAGNOSIS — R05 Cough: Secondary | ICD-10-CM

## 2015-08-15 MED ORDER — CEFDINIR 300 MG PO CAPS: 300.0000 mg | ORAL_CAPSULE | Freq: Two times a day (BID) | ORAL | Status: DC

## 2015-08-15 MED ORDER — HYDROCODONE-HOMATROPINE 5-1.5 MG/5ML PO SYRP: 5.0000 mL | ORAL_SOLUTION | Freq: Four times a day (QID) | ORAL | Status: DC | PRN

## 2015-08-15 NOTE — Assessment & Plan Note (Signed)
Cough regimen  Check allergy profile , tsh and cbc w/ diff   Plan  Omnicef 300mg  Twice daily  For 10 days -take with food  Refer to ENT for hoarseness and VCD .  Labs today  Check CT sinus .  Take Protonix 40 mg daily before meal Take Pepcid 20 mg at bedtime Take Zyrtec 10 mg in the morning.   take Chlor-Trimeton 4 mg 2 at at bedtime.  Avoid clearing the throat and coughing Use sips of water to help soothe the throat Delsym 2 teaspoons twice daily as needed for cough. Follow up with Dr. Ashok Cordia in 4 weeks and As needed   Please contact office for sooner follow up if symptoms do not improve or worsen or seek emergency care ';

## 2015-08-15 NOTE — Assessment & Plan Note (Signed)
?   Flare w/ UACS +/- sinusitis  Will refer to ENT.   Plan  Omnicef 300mg  Twice daily  For 10 days -take with food  Refer to ENT for hoarseness and VCD .  Labs today  Check CT sinus .  Take Protonix 40 mg daily before meal Take Pepcid 20 mg at bedtime Take Zyrtec 10 mg in the morning.   take Chlor-Trimeton 4 mg 2 at at bedtime.  Avoid clearing the throat and coughing Use sips of water to help soothe the throat Delsym 2 teaspoons twice daily as needed for cough. Follow up with Dr. Ashok Cordia in 4 weeks and As needed   Please contact office for sooner follow up if symptoms do not improve or worsen or seek emergency care ';

## 2015-08-15 NOTE — Progress Notes (Signed)
Subjective:     Patient ID: Allison Guerra, female   DOB: 09-Feb-1966   MRN: [id number]    Brief patient profile:  34 yobf quit smoking 1988 about the time she learned she was pregnant  and no trouble at all with breathing until mid 90's dx as asthma on prn saba started Advair in early 2000s  But freq flares and referred to pulmonary clinic 05/17/2015 p most recent admit:   Admit date: 04/30/2015 Discharge date: 05/08/2015   Recommendations for Outpatient Follow-up:  1. Please follow-up on vocal cord dysfunction and obstructive sleep apnea 2. She was given hospital follow-up with pulmonary medicine 3. Will need outpatient sleep study  Discharge Diagnoses:  Principal Problem:  Acute respiratory failure   CAP (community acquired pneumonia)  Asthma exacerbation  Hypertension  Sleep apnea  SOB (shortness of breath)  Cough  Vocal cord dysfunction  Constipation  Elevated blood sugar  Anxiety  Bacteremia  Prediabetes  Filed Weights   05/01/15 1230 05/05/15 0451  Weight: 114.443 kg (252 lb 4.8 oz) 113.581 kg (250 lb 6.4 oz)    History of present illness:  Allison Guerra is a 49 y.o. female with PMH of hypertension, asthma, GERD, anxiety, OSA, vocal cord dysfunction, who presents with the shortness of breath and productive cough.  Patient reports that she started having worsening shortness of breath at about 3 PM today. She coughs up yellow colored sputum in the past several days. She had one episode of mild chest pain, which has resolved. Currently not any chest pain. She has subjective fever, but no chills. She has nausea, but no abdominal pain, diarrhea. She is constipated. She reports that she had 2 hour traveling by car to [facility]. No tenderness over calf areas.   In ED, patient was found to have WBC 9.4, blood sugar 249 on BMP, temperature normal, tachycardia, electrolytes okay. Chest x-ray showed minimal opacity over both costophrenic angles  which appears most consistent with atelectasis, but pneumonia particularly on the left difficult to exclude.  Hospital Course:  Patient is a pleasant 49 year old female with past medical history of vocal cord dysfunction, sleep apnea, morbid obesity, admitted to the medicine service on 04/30/2015 when she presented with complaints of cough, shortness of breath, wheezing. Initial workup included a chest x-ray that showed minimal opacity over both costophrenic angles appear to be more consistent with atelectasis. There is also question of developing pneumonia on the left. She was started on empiric IV antibiotic therapy along with beta agonist therapy and systemic steroids in the context of an acute asthmatic exacerbation. During this hospitalization she was seen and evaluated by pulmonary critical care medicine. Felt that gastroesophageal reflux disease likely contributing to symptoms along with untreated obstructive sleep apnea. Patient will require sleep study in the outpatient setting and likely CPAP. She showed gradual clinical improvement. She was discharged to her home in stable condition on 05/08/2015 with instructions to follow-up with pulmonary critical care medicine in the outpatient setting.        05/17/2015 1st [facility] office visit/post hosp f/u /  Melvyn Novas  On advair 500  Chief Complaint  Patient presents with  . HFU    Pt states that her breathing has improved some since hospital d/c.   still sob with more than mid adls with audible insp wheeze heard across the exam room and dry harsh coughing fits , more day than noct rec Stop advair and fish oil Start dulera 100 Take 2 puffs first thing in am  and then another 2 puffs about 12 hours later.  Work on inhaler technique:   Only use your albuterol as a rescue medication For cough > tussionex as needed > transition to delsym  Pantoprazole 40 mg Take 30- 60 min before your first and last meals of the day  GERD diet  Please  schedule a follow up office visit in 2 weeks, sooner if needed to See Dr Milinda Hirschfeld or me if he's not available    05/31/2015  f/u ov/Wert re: uacs/ requesting more tussionex "only thing that worksAdvertising account planner Complaint  Patient presents with  . Follow-up    UACS. Pt c/o of chronic mostly wet cough with white/yellow/green mucus. Pt states that her cough is worse at night when sleeping. Pt also c/o of SOB/wheeze/chest tightness. Pt uses her albuterol inhaler and/or nebulizer 2 times a day. Pt also c/o of increased dizziness immediately after coughing.    coughing so severely has presyncope/ gagging/wheezing Offered tramadol but has not picked it up  >>  08/15/2015 Follow up : Cough and VCD  Patient returns for persistent cough and wheezing. Complains that she's coughing up green mucus and has a lot of sinus drainage, congestion and pressure. Her voice is been a very hoarse, and she's had a lot of wheezing coming from her throat. She has significant throat clearing throughout the day. Says that she has been seen twice for similar symptoms and treated with prednisone. She feels that this does not help. Patient was seen months ago for similar symptoms after hospitalization. Patient was started on a cough regimen with Protonix and Pepcid and Chlor-Trimeton. Visit. She was diagnosed with VCD when she lived in [place]. Says that she was seen by ENT. No records are available at this time.. Records requested.  Chest x-ray last visit did not show any acute process.   Has OSA , CPAP noncompliant . Says cant wear due to severe anxiety.   Past Medical History  Diagnosis Date  . Asthma   . Hypertension   . Sleep apnea   . Vocal cord dysfunction   . Anxiety    Current Outpatient Prescriptions on File Prior to Visit  Medication Sig Dispense Refill  . albuterol (PROAIR HFA) 108 (90 BASE) MCG/ACT inhaler Inhale 2 puffs into the lungs every 6 (six) hours as needed for wheezing or shortness of breath.    Marland Kitchen  albuterol (PROVENTIL) (2.5 MG/3ML) 0.083% nebulizer solution Take 2.5 mg by nebulization every 6 (six) hours as needed for wheezing or shortness of breath.    . ALPRAZolam (XANAX) 0.25 MG tablet Take 0.25 mg by mouth 3 (three) times daily as needed for anxiety or sleep.     . meloxicam (MOBIC) 15 MG tablet Take 15 mg by mouth daily as needed.     . mometasone-formoterol (DULERA) 100-5 MCG/ACT AERO Take 2 puffs first thing in am and then another 2 puffs about 12 hours later.    . pantoprazole (PROTONIX) 40 MG tablet Take 1 tablet (40 mg total) by mouth 2 (two) times daily. 60 tablet 1  . promethazine-codeine (PHENERGAN WITH CODEINE) 6.25-10 MG/5ML syrup Take 5 mLs by mouth every 6 (six) hours as needed for cough. 200 mL 0  . Respiratory Therapy Supplies (FLUTTER) DEVI Use as directed 1 each 0  . senna (SENOKOT) 8.6 MG TABS tablet Take 1 tablet (8.6 mg total) by mouth daily. 120 each 0  . valsartan-hydrochlorothiazide (DIOVAN-HCT) 160-12.5 MG per tablet Take 1 tablet by mouth daily.    Marland Kitchen  Eszopiclone 3 MG TABS Take 3 mg by mouth at bedtime. Reported on 08/15/2015  0  . ibuprofen (ADVIL,MOTRIN) 600 MG tablet Take 600 mg by mouth daily as needed for moderate pain. Reported on 08/15/2015    . traMADol (ULTRAM) 50 MG tablet Take 2 tablets (100 mg total) by mouth every 4 (four) hours as needed. (Patient not taking: Reported on 08/15/2015) 40 tablet 0   No current facility-administered medications on file prior to visit.      Current Medications, Allergies, Complete Past Medical History, Past Surgical History, Family History, and Social History were reviewed in [employer] record.  ROS  The following are not active complaints unless bolded sore throat, dysphagia, dental problems, itching, sneezing,  nasal congestion or excess/ purulent secretions, ear ache,   fever, chills, sweats, unintended wt loss, classically pleuritic or exertional cp, hemoptysis,  orthopnea pnd or leg  swelling, presyncope, palpitations, abdominal pain, anorexia, nausea, vomiting, diarrhea  or change in bowel or bladder habits, change in stools or urine, dysuria,hematuria,  rash, arthralgias, visual complaints, headache, numbness, weakness or ataxia or problems with walking or coordination,  change in mood/affect or memory.               Objective:   Physical Exam  Obese amb bf with + pseudowheeze Filed Vitals:   08/15/15 1053  BP: 112/74  Pulse: 88  Temp: 98.7 F (37.1 C)  TempSrc: Oral  Height: 5\' 3"  (1.6 m)  Weight: 249 lb (112.946 kg)  SpO2: 96%     HEENT: nl dentition, turbinates, and orophanx. Nl external ear canals without cough reflex   NECK :  without JVD/Nodes/TM/ nl carotid upstrokes bilaterally No stridor noted   LUNGS: no acc muscle use, clear to A and P bilaterally w/ pursed lip breathing , psuedowheeze resolves almost totally with this maneuvr. Speaks in full sentence.   CV:  RRR  no s3 or murmur or increase in P2, no edema   ABD:  soft and nontender with nl excursion in the supine position. No bruits or organomegaly, bowel sounds nl  MS:  warm without deformities, calf tenderness, cyanosis or clubbing  SKIN: warm and dry without lesions    NEURO:  alert, approp, no deficits      CXR:  05/17/2015  No active disease.      Assessment:

## 2015-08-15 NOTE — Progress Notes (Signed)
Chart and office note reviewed in detail  And independently examined > agree with a/p as outlined > this is clearly much more likely  vcd >>> asthma

## 2015-08-15 NOTE — Assessment & Plan Note (Signed)
Intolerant to CPAP ,  Will look at going forward.

## 2015-08-15 NOTE — Patient Instructions (Addendum)
Omnicef 300mg  Twice daily  For 10 days -take with food  Refer to ENT for hoarseness and VCD .  Labs today  Check CT sinus .  Take Protonix 40 mg daily before meal Take Pepcid 20 mg at bedtime Take Zyrtec 10 mg in the morning.   take Chlor-Trimeton 4 mg 2 at at bedtime.  Avoid clearing the throat and coughing Use sips of water to help soothe the throat Delsym 2 teaspoons twice daily as needed for cough. Hydromet 1-2 tsp every 6 hr As needed  Cough , may make you sleepy .  Follow up with Dr. Ashok Cordia in 4 weeks and As needed   Please contact office for sooner follow up if symptoms do not improve or worsen or seek emergency care ';

## 2015-08-15 NOTE — Addendum Note (Signed)
Addended by: Osa Craver on: 08/15/2015 11:55 AM   Modules accepted: Orders

## 2015-08-20 ENCOUNTER — Inpatient Hospital Stay: Admission: RE | Admit: 2015-08-20 | Payer: Managed Care, Other (non HMO) | Source: Ambulatory Visit

## 2015-08-22 ENCOUNTER — Inpatient Hospital Stay: Admission: RE | Admit: 2015-08-22 | Payer: Managed Care, Other (non HMO) | Source: Ambulatory Visit

## 2015-08-27 ENCOUNTER — Ambulatory Visit (INDEPENDENT_AMBULATORY_CARE_PROVIDER_SITE_OTHER)
Admission: RE | Admit: 2015-08-27 | Discharge: 2015-08-27 | Disposition: A | Discharge status: Home / Self Care | Payer: Managed Care, Other (non HMO) | Source: Ambulatory Visit | Attending: Adult Health | Admitting: Adult Health

## 2015-08-27 DIAGNOSIS — R05 Cough: Secondary | ICD-10-CM

## 2015-08-27 DIAGNOSIS — J383 Other diseases of vocal cords: Secondary | ICD-10-CM | POA: Diagnosis not present

## 2015-09-23 ENCOUNTER — Telehealth: Payer: Self-pay | Admitting: Internal Medicine

## 2015-09-23 MED ORDER — PROMETHAZINE-CODEINE 6.25-10 MG/5ML PO SYRP: 5.0000 mL | ORAL_SOLUTION | Freq: Four times a day (QID) | ORAL | Status: DC | PRN

## 2015-09-23 NOTE — Telephone Encounter (Signed)
Attempted to contact patient, left message for patient to call back.

## 2015-09-23 NOTE — Telephone Encounter (Signed)
Patient Returned call [phone]

## 2015-09-23 NOTE — Telephone Encounter (Signed)
Patient was given cough syrup on 12/18 (Phenergan with Codeine) and said that this cough syrup works great for her cough.  She was given Hydromet on 12/22, but cannot take this medication, she said that this medication makes her very sick, nausea/vomiting (added to allergy list).  Patient wanted to know if Dr. Melvyn Novas would call in the Phenergan syrup for her.  Pharmacy: [facility] - [address]  Patient Instructions     Omnicef 300mg  Twice daily For 10 days -take with food  Refer to ENT for hoarseness and VCD .  Labs today  Check CT sinus .  Take Protonix 40 mg daily before meal Take Pepcid 20 mg at bedtime Take Zyrtec 10 mg in the morning.  take Chlor-Trimeton 4 mg 2 at at bedtime.  Avoid clearing the throat and coughing Use sips of water to help soothe the throat Delsym 2 teaspoons twice daily as needed for cough. Hydromet 1-2 tsp every 6 hr As needed Cough , may make you sleepy .  Follow up with Dr. Ashok Cordia in 4 weeks and As needed  Please contact office for sooner follow up if symptoms do not improve or worsen or seek emergency care ';

## 2015-09-23 NOTE — Telephone Encounter (Signed)
Yes  but needs to f/u with ent/ [facility] as rec so rec set up appt and then give her enough rob ac to use up to 2 tsp qid in meantime

## 2015-09-23 NOTE — Telephone Encounter (Addendum)
Saw ENT around the New Year - was told "everything checked out".  Had testing done of her sinuses, but is unable to remember what exactly the test was.  Dr Victorio Palm office called to request records - to Amanda/TP's attn.  Hydromet removed from pt's med list Refill on prometh-codeine syrup up to 2tsp every 6 hours as needed #262mL (quantity last given), 0 refills telephoned to [facility] on [address] (verified pharmacy with patient) and spoke with pharmacist Mortimer Fries.  Nothing further needed at this time, will sign off.

## 2015-09-25 ENCOUNTER — Encounter (HOSPITAL_COMMUNITY): Admission: RE | Payer: Self-pay | Source: Ambulatory Visit

## 2015-09-25 ENCOUNTER — Ambulatory Visit (HOSPITAL_COMMUNITY)
Admission: RE | Admit: 2015-09-25 | Payer: Managed Care, Other (non HMO) | Source: Ambulatory Visit | Admitting: General Surgery

## 2015-09-25 SURGERY — COLONOSCOPY
Anesthesia: Moderate Sedation

## 2015-12-03 ENCOUNTER — Other Ambulatory Visit: Payer: Self-pay | Admitting: Gastroenterology

## 2016-01-06 ENCOUNTER — Emergency Department (HOSPITAL_COMMUNITY): Payer: Managed Care, Other (non HMO)

## 2016-01-06 ENCOUNTER — Observation Stay (HOSPITAL_COMMUNITY)
Admission: EM | Admit: 2016-01-06 | Discharge: 2016-01-06 | Disposition: A | Discharge status: Home / Self Care | Payer: Managed Care, Other (non HMO) | Attending: Family Medicine | Admitting: Family Medicine

## 2016-01-06 ENCOUNTER — Encounter (HOSPITAL_COMMUNITY): Payer: Self-pay

## 2016-01-06 DIAGNOSIS — M7918 Myalgia, other site: Secondary | ICD-10-CM

## 2016-01-06 DIAGNOSIS — K210 Gastro-esophageal reflux disease with esophagitis: Secondary | ICD-10-CM | POA: Diagnosis not present

## 2016-01-06 DIAGNOSIS — E669 Obesity, unspecified: Secondary | ICD-10-CM | POA: Diagnosis not present

## 2016-01-06 DIAGNOSIS — I10 Essential (primary) hypertension: Secondary | ICD-10-CM | POA: Diagnosis present

## 2016-01-06 DIAGNOSIS — Z87891 Personal history of nicotine dependence: Secondary | ICD-10-CM | POA: Diagnosis not present

## 2016-01-06 DIAGNOSIS — R079 Chest pain, unspecified: Principal | ICD-10-CM | POA: Diagnosis present

## 2016-01-06 DIAGNOSIS — R0781 Pleurodynia: Secondary | ICD-10-CM

## 2016-01-06 DIAGNOSIS — G4730 Sleep apnea, unspecified: Secondary | ICD-10-CM | POA: Insufficient documentation

## 2016-01-06 DIAGNOSIS — K219 Gastro-esophageal reflux disease without esophagitis: Secondary | ICD-10-CM | POA: Diagnosis not present

## 2016-01-06 DIAGNOSIS — Z791 Long term (current) use of non-steroidal anti-inflammatories (NSAID): Secondary | ICD-10-CM | POA: Insufficient documentation

## 2016-01-06 DIAGNOSIS — Z6841 Body Mass Index (BMI) 40.0 and over, adult: Secondary | ICD-10-CM | POA: Insufficient documentation

## 2016-01-06 DIAGNOSIS — Z7951 Long term (current) use of inhaled steroids: Secondary | ICD-10-CM | POA: Insufficient documentation

## 2016-01-06 DIAGNOSIS — E119 Type 2 diabetes mellitus without complications: Secondary | ICD-10-CM | POA: Insufficient documentation

## 2016-01-06 DIAGNOSIS — Z79899 Other long term (current) drug therapy: Secondary | ICD-10-CM | POA: Diagnosis not present

## 2016-01-06 DIAGNOSIS — J45901 Unspecified asthma with (acute) exacerbation: Secondary | ICD-10-CM | POA: Diagnosis not present

## 2016-01-06 DIAGNOSIS — M791 Myalgia: Secondary | ICD-10-CM | POA: Insufficient documentation

## 2016-01-06 LAB — TROPONIN I: Troponin I: <0.03 ng/mL (ref <0.031)

## 2016-01-06 LAB — CBC
HCT: 37.9 % (ref 36.0–46.0)
Hemoglobin: 12.2 g/dL (ref 12.0–15.0)
MCH: 27.6 pg (ref 26.0–34.0)
MCHC: 32.2 g/dL (ref 30.0–36.0)
MCV: 85.7 fL (ref 78.0–100.0)
Platelets: 288 10*3/uL (ref 150–400)
RBC: 4.42 MIL/uL (ref 3.87–5.11)
RDW: 15.8 % — ABNORMAL HIGH (ref 11.5–15.5)
WBC: 8.2 10*3/uL (ref 4.0–10.5)

## 2016-01-06 LAB — I-STAT TROPONIN, ED: TROPONIN I, POC: 0 ng/mL (ref 0.00–0.08)

## 2016-01-06 LAB — BASIC METABOLIC PANEL
Anion gap: 12 (ref 5–15)
BUN: 10 mg/dL (ref 6–20)
CO2: 26 mmol/L (ref 22–32)
Calcium: 10 mg/dL (ref 8.9–10.3)
Chloride: 100 mmol/L — ABNORMAL LOW (ref 101–111)
Creatinine, Ser: 0.82 mg/dL (ref 0.44–1.00)
GFR calc Af Amer: >60 mL/min (ref >60)
GFR calc non Af Amer: >60 mL/min (ref >60)
Glucose, Bld: 139 mg/dL — ABNORMAL HIGH (ref 65–99)
Potassium: 3.5 mmol/L (ref 3.5–5.1)
Sodium: 138 mmol/L (ref 135–145)

## 2016-01-06 MED ORDER — DEXAMETHASONE 4 MG PO TABS
10.0000 mg | ORAL_TABLET | Freq: Once | ORAL | Status: AC
  Administered 2016-01-06: 10 mg via ORAL
  Filled 2016-01-06: qty 3

## 2016-01-06 MED ORDER — NITROGLYCERIN 0.4 MG SL SUBL
0.4000 mg | SUBLINGUAL_TABLET | SUBLINGUAL | Status: DC | PRN
  Filled 2016-01-06: qty 1

## 2016-01-06 MED ORDER — DICLOFENAC SODIUM 1 % TD GEL
4.0000 g | Freq: Once | TRANSDERMAL | Status: AC
  Administered 2016-01-06: 4 g via TOPICAL
  Filled 2016-01-06: qty 100

## 2016-01-06 MED ORDER — IPRATROPIUM-ALBUTEROL 0.5-2.5 (3) MG/3ML IN SOLN
3.0000 mL | Freq: Once | RESPIRATORY_TRACT | Status: AC
  Administered 2016-01-06: 3 mL via RESPIRATORY_TRACT
  Filled 2016-01-06: qty 3

## 2016-01-06 MED ORDER — GI COCKTAIL ~~LOC~~
30.0000 mL | Freq: Once | ORAL | Status: AC
  Administered 2016-01-06: 30 mL via ORAL
  Filled 2016-01-06: qty 30

## 2016-01-06 MED ORDER — ASPIRIN 81 MG PO CHEW
324.0000 mg | CHEWABLE_TABLET | Freq: Once | ORAL | Status: AC
  Administered 2016-01-06: 324 mg via ORAL
  Filled 2016-01-06: qty 4

## 2016-01-06 NOTE — ED Notes (Signed)
Gave pt [place] sandwich, applesauce and apple juice, per Resident MD - Verlene Mayer. Informed Hayley - RN.

## 2016-01-06 NOTE — ED Provider Notes (Signed)
CSN: [id number]     Arrival date & time 01/06/16  1105 History   First MD Initiated Contact with Patient 01/06/16 1123     Chief Complaint  Patient presents with  . Chest Pain     (Consider location/radiation/quality/duration/timing/severity/associated sxs/prior Treatment) Patient is a 50 y.o. female presenting with general illness. The history is provided by the patient and medical records.  Illness Location:  Chest pain Severity:  Moderate Onset quality:  Gradual Duration:  2 days Timing:  Constant Progression:  Unchanged Chronicity:  New Context:  History of prediabetes, hypertension, obesity, asthma. 2 days of cough, congestion, shortness of breath. Onset of substernal pressure-like chest pain. Also having a sharp reproducible chest wall pain. No fevers and chills. No other infectious symptoms. Associated symptoms: chest pain, cough, shortness of breath and wheezing   Associated symptoms: no abdominal pain, no diarrhea, no fever, no headaches, no nausea and no vomiting     Past Medical History  Diagnosis Date  . Asthma   . Hypertension   . Sleep apnea   . Vocal cord dysfunction   . Anxiety    Past Surgical History  Procedure Laterality Date  . Uterine fibroid surgery    . Partial hysterectomy     Family History  Problem Relation Age of Onset  . Cancer - Lung Mother   . Hypertension Father   . Hypertension Sister    Social History  Substance Use Topics  . Smoking status: Former Smoker -- 0.50 packs/day    Types: Cigarettes    Quit date: 08/24/1994  . Smokeless tobacco: Never Used  . Alcohol Use: Yes     Comment: occasionally   OB History    No data available     Review of Systems  Constitutional: Negative for fever, chills and diaphoresis.  Respiratory: Positive for cough, shortness of breath and wheezing. Negative for stridor.   Cardiovascular: Positive for chest pain. Negative for palpitations and leg swelling.  Gastrointestinal: Negative for nausea,  vomiting, abdominal pain and diarrhea.  Musculoskeletal: Negative for back pain.  Neurological: Negative for light-headedness and headaches.  All other systems reviewed and are negative.     Allergies  Hydromet; Reglan; and Prednisone  Home Medications   Prior to Admission medications   Medication Sig Start Date End Date Taking? Authorizing Provider  albuterol (PROAIR HFA) 108 (90 BASE) MCG/ACT inhaler Inhale 2 puffs into the lungs every 6 (six) hours as needed for wheezing or shortness of breath.   Yes Historical Provider, MD  albuterol (PROVENTIL) (2.5 MG/3ML) 0.083% nebulizer solution Take 2.5 mg by nebulization every 6 (six) hours as needed for wheezing or shortness of breath.   Yes Historical Provider, MD  ibuprofen (ADVIL,MOTRIN) 600 MG tablet Take 600 mg by mouth daily as needed for moderate pain. Reported on 08/15/2015   Yes Historical Provider, MD  mometasone-formoterol (DULERA) 100-5 MCG/ACT AERO Take 2 puffs first thing in am and then another 2 puffs about 12 hours later. 05/17/15  Yes Tanda Rockers, MD  pantoprazole (PROTONIX) 40 MG tablet Take 1 tablet (40 mg total) by mouth 2 (two) times daily. 05/08/15  Yes Kelvin Cellar, MD  Respiratory Therapy Supplies (FLUTTER) DEVI Use as directed 05/31/15  Yes Tanda Rockers, MD  valsartan-hydrochlorothiazide (DIOVAN-HCT) 160-25 MG tablet Take 1 tablet by mouth daily. 12/02/15  Yes Historical Provider, MD  cefdinir (OMNICEF) 300 MG capsule Take 1 capsule (300 mg total) by mouth 2 (two) times daily. Patient not taking: Reported on 01/06/2016 08/15/15  Tammy S Parrett, NP  promethazine-codeine (PHENERGAN WITH CODEINE) 6.25-10 MG/5ML syrup Take 5-10 mLs by mouth every 6 (six) hours as needed for cough. Patient not taking: Reported on 01/06/2016 09/23/15   Tanda Rockers, MD  senna (SENOKOT) 8.6 MG TABS tablet Take 1 tablet (8.6 mg total) by mouth daily. Patient not taking: Reported on 01/06/2016 05/08/15   Kelvin Cellar, MD  traMADol  (ULTRAM) 50 MG tablet Take 2 tablets (100 mg total) by mouth every 4 (four) hours as needed. Patient not taking: Reported on 08/15/2015 05/29/15   Tanda Rockers, MD   BP 126/61 mmHg  Pulse 84  Temp(Src) 98.8 F (37.1 C) (Oral)  Resp 18  Ht 5\' 4"  (1.626 m)  Wt 111.993 kg  BMI 42.36 kg/m2  SpO2 97% Physical Exam  Constitutional: She is oriented to person, place, and time. She appears well-developed and well-nourished. No distress.  HENT:  Head: Normocephalic and atraumatic.  Eyes: EOM are normal. Pupils are equal, round, and reactive to light.  Neck: Normal range of motion.  Cardiovascular: Normal rate and regular rhythm.   Pulmonary/Chest: No accessory muscle usage. Tachypnea noted. No respiratory distress. She has wheezes. She exhibits tenderness.  Abdominal: Soft. Normal appearance. There is no tenderness.  Musculoskeletal:       Right lower leg: She exhibits no swelling and no edema.       Left lower leg: She exhibits no swelling and no edema.  Neurological: She is alert and oriented to person, place, and time. GCS eye subscore is 4. GCS verbal subscore is 5. GCS motor subscore is 6.  Skin: Skin is warm and dry.    ED Course  Procedures (including critical care time) Labs Review Labs Reviewed  BASIC METABOLIC PANEL - Abnormal; Notable for the following:    Chloride 100 (*)    Glucose, Bld 139 (*)    All other components within normal limits  CBC - Abnormal; Notable for the following:    RDW 15.8 (*)    All other components within normal limits  TROPONIN I  I-STAT TROPOININ, ED    Imaging Review Dg Chest 2 View  01/06/2016  CLINICAL DATA:  Left-sided chest pain with nausea and dizziness. Shortness of breath. EXAM: CHEST  2 VIEW COMPARISON:  May 17, 2015 FINDINGS: Lungs are clear. Heart size and pulmonary vascularity are normal. No adenopathy. There is degenerative change in the thoracic spine. IMPRESSION: No edema or consolidation. Electronically Signed   By:  Lowella Grip III M.D.   On: 01/06/2016 11:45   I have personally reviewed and evaluated these images and lab results as part of my medical decision-making.   EKG Interpretation   Date/Time:  Monday Jan 06 2016 11:12:08 EDT Ventricular Rate:  94 PR Interval:  146 QRS Duration: 82 QT Interval:  362 QTC Calculation: 452 R Axis:   -29 Text Interpretation:  Normal sinus rhythm Minimal voltage criteria for  LVH, may be normal variant Nonspecific T wave abnormality Abnormal ECG  Confirmed by [facility] MD, Corene Cornea [phone]) on 01/06/2016 11:22:27 AM Also  confirmed by [facility] MD, JASON [phone]), editor [place] CT, Leda Gauze [phone])   on 01/06/2016 11:32:30 AM      MDM   Final diagnoses:  Chest pain, unspecified chest pain type  Asthma exacerbation  Musculoskeletal pain    50 year old female with history of diabetes, hypertension, obesity, asthma, presenting with likely asthma exacerbation and chest wall tenderness. Started a day and half ago. Getting progressively worse. On arrival here  the patient is well-appearing in no acute distress. Vital signs normal and stable. Patient is wheezing diffusely on exam. She has some tenderness palpation of her anterior chest wall. She reports that the chest wall pain is sharp in nature; reports that she's also been having a pressure-like left-sided chest pain. EKG showed some nonspecific ST changes in the inferior leads. Delta troponin negative. Lower suspicion for active ACS at this time. Electrolytes within normal limits. No leukocytosis. No anemia. Chest x-ray without evidence of pneumonia or pneumothorax.  Considered admitting the patient to the hospital for ACS rule out. The hospitalist evaluated the patient at bedside. They do not feel that this is ACS at this at this time. He recommended giving the patient breathing treatments and steroids and having her follow up on an outpatient basis with her primary care doctor, as well as medications to help treat her  chest wall pain.  Spoke with patient regarding this. She is in agreement with the plan. She has good follow-up with her primary care doctor.  Strict return precautions provided. Encouraged her to use her albuterol inhaler scheduled every 4 hours for the next several days. Encouraged her to use Motrin scheduled every 4-6 hours for pain.  Patient discharged in stable condition.  Maryan Puls, MD 01/06/16 [place], MD 01/07/16 [phone]

## 2016-01-06 NOTE — ED Notes (Signed)
Do not administer NTG at this time per MD Merrell.  New orders to be placed.

## 2016-01-06 NOTE — ED Notes (Signed)
Admitting at bedside 

## 2016-01-06 NOTE — ED Notes (Signed)
IV attempt unsuccessful, 2nd RN to attempt.

## 2016-01-06 NOTE — Consult Note (Signed)
Medical Consultation   Allison Guerra  [id number]  DOB: 10/04/1965  DOA: 01/06/2016  PCP: Lilian Coma, MD  Outpatient Specialists: none   Requesting physician: Dr. Verlene Mayer - [facility]  Reason for consultation: CP   History of Present Illness: Allison Guerra is an 50 y.o. female with a past medical history significant for obesity, hypertension, asthma, reflux who is presenting with one-day history of chest pain. Patient states that her chest pain started 1 day ago at approximate 7:00 in the morning when she got out of bed. Chest pain is substernal with slight radiation to the left under the left breast. States the pain is constant. Sharp in nature with certain movements. Nothing makes it better. Chest pain is not related to exertion. Patient was given nitroglycerin at an urgent care prior to coming to the emergency room which he said helped relieve some of her pain. Patient states she is unable sleep last night because the pain was so bad. Patient recently changed her mattress type and changed how she sleeps and now sleeps on her left side. Pain is worsened with deep respirations. States that she started coughing and wheezing approximately one week ago. This is very normal for her at this time of year. This typically relieved with her home albuterol treatments. Patient states that she has acid reflux which is at baseline. Patient took ibuprofen 800 mg 1 which did not relieve her symptoms. Denies any recent heavy lifting or change in exercise habits. Denies any fevers, nausea, vomiting or symptoms, headache, palpitations, shortness of breath.    Review of Systems:  ROS As per HPI otherwise 10 point review of systems negative.   Past Medical History: Past Medical History  Diagnosis Date  . Asthma   . Hypertension   . Sleep apnea   . Vocal cord dysfunction   . Anxiety     Past Surgical History: Past Surgical History  Procedure Laterality Date  . Uterine  fibroid surgery    . Partial hysterectomy       Allergies:   Allergies  Allergen Reactions  . Hydromet [Hydrocodone-Homatropine] Nausea And Vomiting  . Reglan [Metoclopramide] Other (See Comments)    Made her aggressive, made her feel crazy  . Prednisone Anxiety    Jittery     Social History:  reports that she quit smoking about 21 years ago. Her smoking use included Cigarettes. She smoked 0.50 packs per day. She has never used smokeless tobacco. She reports that she drinks alcohol. She reports that she does not use illicit drugs.   Family History: Family History  Problem Relation Age of Onset  . Cancer - Lung Mother   . Hypertension Father   . Hypertension Sister      Physical Exam: Filed Vitals:   01/06/16 1230 01/06/16 1300 01/06/16 1330 01/06/16 1400  BP: 113/66 129/81 144/79 147/75  Pulse: 83 78 81 81  Temp:      TempSrc:      Resp: 17 15 19 18   Height:      Weight:      SpO2: 99% 99% 97% 97%    Constitutional:Alert and awake, oriented x3, not in any acute distress. Eyes: PERLA, EOMI, irises appear normal, anicteric sclera,  ENMT: external ears and nose appear normal,             Lips appears normal, oropharynx mucosa, tongue, posterior pharynx appear normal  Neck: neck appears normal, no  masses, normal ROM, no thyromegaly, no JVD  CVS: S1-S2 clear, no murmur rubs or gallops, no LE edema, normal pedal pulses  Respiratory:  Diffuse wheezing in all lung fields predominantly on the left. Normal effort. Abdomen: soft nontender, nondistended, normal bowel sounds, no hepatosplenomegaly, no hernias  Musculoskeletal: : no cyanosis, clubbing or edema noted bilaterally, chest pain reproducible on palpation of the middle to inferior breast bone.  Neuro: Cranial nerves II-XII intact, strength, sensation, reflexes Psych: judgement and insight appear normal, stable mood and affect, mental status Skin: no rashes or lesions or ulcers, no induration or nodules    Data  reviewed:  I have personally reviewed following labs and imaging studies Labs:  CBC:  Recent Labs Lab 01/06/16 1118  WBC 8.2  HGB 12.2  HCT 37.9  MCV 85.7  PLT 123XX123    Basic Metabolic Panel:  Recent Labs Lab 01/06/16 1118  NA 138  K 3.5  CL 100*  CO2 26  GLUCOSE 139*  BUN 10  CREATININE 0.82  CALCIUM 10.0   GFR Estimated Creatinine Clearance: 99.4 mL/min (by C-G formula based on Cr of 0.82). Liver Function Tests: No results for input(s): AST, ALT, ALKPHOS, BILITOT, PROT, ALBUMIN in the last 168 hours. No results for input(s): LIPASE, AMYLASE in the last 168 hours. No results for input(s): AMMONIA in the last 168 hours. Coagulation profile No results for input(s): INR, PROTIME in the last 168 hours.  Cardiac Enzymes: No results for input(s): CKTOTAL, CKMB, CKMBINDEX, TROPONINI in the last 168 hours. BNP: Invalid input(s): POCBNP CBG: No results for input(s): GLUCAP in the last 168 hours. D-Dimer No results for input(s): DDIMER in the last 72 hours. Hgb A1c No results for input(s): HGBA1C in the last 72 hours. Lipid Profile No results for input(s): CHOL, HDL, LDLCALC, TRIG, CHOLHDL, LDLDIRECT in the last 72 hours. Thyroid function studies No results for input(s): TSH, T4TOTAL, T3FREE, THYROIDAB in the last 72 hours.  Invalid input(s): FREET3 Anemia work up No results for input(s): VITAMINB12, FOLATE, FERRITIN, TIBC, IRON, RETICCTPCT in the last 72 hours. Urinalysis No results found for: COLORURINE, APPEARANCEUR, LABSPEC, [place], GLUCOSEU, HGBUR, BILIRUBINUR, KETONESUR, PROTEINUR, UROBILINOGEN, NITRITE, [place]   Microbiology No results found for this or any previous visit (from the past 240 hour(s)).     Inpatient Medications:   Scheduled Meds: . diclofenac sodium  4 g Topical Once   Continuous Infusions:    Radiological Exams on Admission: Dg Chest 2 View  01/06/2016  CLINICAL DATA:  Left-sided chest pain with nausea and dizziness.  Shortness of breath. EXAM: CHEST  2 VIEW COMPARISON:  May 17, 2015 FINDINGS: Lungs are clear. Heart size and pulmonary vascularity are normal. No adenopathy. There is degenerative change in the thoracic spine. IMPRESSION: No edema or consolidation. Electronically Signed   By: Lowella Grip III M.D.   On: 01/06/2016 11:45    Impression/Recommendations Active Problems:   Hypertension   Chest pain   Asthma exacerbation   Musculoskeletal pain   Pleuritic chest pain   GERD (gastroesophageal reflux disease)  Chest Pain: Likely multifactorial including musculoskeletal and pleuritic. Heart score 3. Chest pain is reproducible with deep respirations as well as with palpation. Patient's history significant for recent asthma exacerbation as well as changing of patient's bedding in sleep habits. EKG without signs of ACS, and troponin negative. ASA without relief. Patient's history also significant for GERD. - At this time I've ordered Decadron 10 mg and a DuoNeb to be provided to the patient for relief  of her asthma exacerbation. - Voltaren gel to be applied to her chest and the location of the pain. - Repeat troponin  I recommended the patient be discharged with an additional 2 doses of Decadron to be taken every morning. Also recommended to the patient that she use her albuterol inhaler every 4 hours for the next 24 hours and every 4 hours as needed. If the patient has relief of her chest pain with a GI cocktail then I would recommend she begin high-dose PPI such as Prilosec for the next 14 days.  Of note patient has been on steroids before without problems with sugars. Prediabetes condition noted with A1c of 6.3.  Patient was told that she may return for further evaluation for some reason her chest pain does not go away or if it becomes worse.   Thank you for this consultation.  Our [facility] hospitalist team will follow the patient with you.    Panzy Bubeck J M.D. [facility] 01/06/2016, 3:03 PM

## 2016-01-06 NOTE — Discharge Instructions (Signed)
Take albuterol scheduled every 4 hours for the next several days. You took Decadron here; you do not need any more doses of steroids.  Take Motrin scheduled every 4-6 hours for your pain.

## 2016-01-06 NOTE — ED Notes (Signed)
Pt c/o CP that started yesterday while pt was lying down. Pt states the pain radiates down her L arm. Pt has associated ShOB; denies nausea and diaphoresis. Pt states she was sent here by urgent care. She was given NTG which relieved her CP. Pt has not taken any ASA today.

## 2016-02-12 ENCOUNTER — Other Ambulatory Visit: Payer: Self-pay | Admitting: Urology

## 2016-02-12 DIAGNOSIS — R319 Hematuria, unspecified: Secondary | ICD-10-CM

## 2016-02-19 ENCOUNTER — Inpatient Hospital Stay: Admission: RE | Admit: 2016-02-19 | Payer: Managed Care, Other (non HMO) | Source: Ambulatory Visit

## 2016-03-02 ENCOUNTER — Ambulatory Visit
Admission: RE | Admit: 2016-03-02 | Discharge: 2016-03-02 | Disposition: A | Discharge status: Home / Self Care | Payer: Managed Care, Other (non HMO) | Source: Ambulatory Visit | Attending: Urology | Admitting: Urology

## 2016-03-02 DIAGNOSIS — R319 Hematuria, unspecified: Secondary | ICD-10-CM

## 2016-03-02 MED ORDER — IOPAMIDOL (ISOVUE-300) INJECTION 61%
125.0000 mL | Freq: Once | INTRAVENOUS | Status: AC | PRN
  Administered 2016-03-02: 125 mL via INTRAVENOUS

## 2016-03-04 ENCOUNTER — Other Ambulatory Visit: Payer: Self-pay | Admitting: Otolaryngology

## 2016-03-04 DIAGNOSIS — K219 Gastro-esophageal reflux disease without esophagitis: Secondary | ICD-10-CM

## 2016-03-24 ENCOUNTER — Other Ambulatory Visit: Payer: Managed Care, Other (non HMO)

## 2016-04-01 ENCOUNTER — Ambulatory Visit
Admission: RE | Admit: 2016-04-01 | Discharge: 2016-04-01 | Disposition: A | Discharge status: Home / Self Care | Payer: Managed Care, Other (non HMO) | Source: Ambulatory Visit | Attending: Otolaryngology | Admitting: Otolaryngology

## 2016-04-01 DIAGNOSIS — K219 Gastro-esophageal reflux disease without esophagitis: Secondary | ICD-10-CM

## 2016-04-01 MED ORDER — IOPAMIDOL (ISOVUE-300) INJECTION 61%
75.0000 mL | Freq: Once | INTRAVENOUS | Status: AC | PRN
  Administered 2016-04-01: 75 mL via INTRAVENOUS

## 2016-07-15 ENCOUNTER — Other Ambulatory Visit: Payer: Self-pay | Admitting: Internal Medicine

## 2016-07-15 DIAGNOSIS — Z1231 Encounter for screening mammogram for malignant neoplasm of breast: Secondary | ICD-10-CM

## 2016-07-21 ENCOUNTER — Encounter (HOSPITAL_COMMUNITY): Payer: Self-pay | Admitting: *Deleted

## 2016-07-21 ENCOUNTER — Other Ambulatory Visit: Payer: Self-pay | Admitting: Urology

## 2016-07-29 ENCOUNTER — Other Ambulatory Visit: Payer: Self-pay | Admitting: Internal Medicine

## 2016-08-11 ENCOUNTER — Ambulatory Visit: Payer: Managed Care, Other (non HMO)

## 2016-08-12 NOTE — Patient Instructions (Addendum)
Allison Guerra  08/12/2016   Your procedure is scheduled on: Wednesday 08/19/2016  Report to [facility] Main  Entrance take [facility]  elevators to 3rd floor to  [place] at  1045 AM.  Call this number if you have problems the morning of surgery [phone]   Remember: ONLY 1 PERSON MAY GO WITH YOU TO SHORT STAY TO GET  READY MORNING OF [place].   Follow Bowel Prep instructions from Dr. Zettie Pho office the day before surgery with a clear liquid diet!  Drink a bottle of Magnesium Citrate the day before surgery by 12 noon along with a clear liquid diet!     CLEAR LIQUID DIET   Foods Allowed                                                                     Foods Excluded  Coffee and tea, regular and decaf                             liquids that you cannot  Plain Jell-O in any flavor                                             see through such as: Fruit ices (not with fruit pulp)                                     milk, soups, orange juice  Iced Popsicles                                    All solid food Carbonated beverages, regular and diet                                    Cranberry, grape and apple juices Sports drinks like Gatorade Lightly seasoned clear broth or consume(fat free) Sugar, honey syrup  Sample Menu Breakfast                                Lunch                                     Supper Cranberry juice                    Beef broth                            Chicken broth Jell-O  Grape juice                           Apple juice Coffee or tea                        Jell-O                                      Popsicle                                                Coffee or tea                        Coffee or tea  _____________________________________________________________________      Do not eat food or drink liquids :After Midnight.     Take these medicines the morning of  surgery with A SIP OF WATER: use Albuterol inhaler if needed and Dulera inhaler                                 You may not have any metal on your body including hair pins and              piercings  Do not wear jewelry, make-up, lotions, powders or perfumes, deodorant             Do not wear nail polish.  Do not shave  48 hours prior to surgery.              Men may shave face and neck.   Do not bring valuables to the hospital. [place].  Contacts, dentures or bridgework may not be worn into surgery.  Leave suitcase in the car. After surgery it may be brought to your room.                  Please read over the following fact sheets you were given: _____________________________________________________________________             [facility] - Preparing for Surgery Before surgery, you can play an important role.  Because skin is not sterile, your skin needs to be as free of germs as possible.  You can reduce the number of germs on your skin by washing with CHG (chlorahexidine gluconate) soap before surgery.  CHG is an antiseptic cleaner which kills germs and bonds with the skin to continue killing germs even after washing. Please DO NOT use if you have an allergy to CHG or antibacterial soaps.  If your skin becomes reddened/irritated stop using the CHG and inform your nurse when you arrive at Short Stay. Do not shave (including legs and underarms) for at least 48 hours prior to the first CHG shower.  You may shave your face/neck. Please follow these instructions carefully:  1.  Shower with CHG Soap the night before surgery and the  morning of Surgery.  2.  If you choose to wash your hair, wash your hair first as usual with your  normal  shampoo.  3.  After you shampoo, rinse your hair and body thoroughly to remove the  shampoo.                           4.  Use CHG as you would any other liquid soap.  You can apply chg directly  to the skin  and wash                       Gently with a scrungie or clean washcloth.  5.  Apply the CHG Soap to your body ONLY FROM THE NECK DOWN.   Do not use on face/ open                           Wound or open sores. Avoid contact with eyes, ears mouth and genitals (private parts).                       Wash face,  Genitals (private parts) with your normal soap.             6.  Wash thoroughly, paying special attention to the area where your surgery  will be performed.  7.  Thoroughly rinse your body with warm water from the neck down.  8.  DO NOT shower/wash with your normal soap after using and rinsing off  the CHG Soap.                9.  Pat yourself dry with a clean towel.            10.  Wear clean pajamas.            11.  Place clean sheets on your bed the night of your first shower and do not  sleep with pets. Day of Surgery : Do not apply any lotions/deodorants the morning of surgery.  Please wear clean clothes to the hospital/surgery center.  FAILURE TO FOLLOW THESE INSTRUCTIONS MAY RESULT IN THE CANCELLATION OF YOUR SURGERY PATIENT SIGNATURE_________________________________  NURSE SIGNATURE__________________________________  ________________________________________________________________________   Adam Phenix  An incentive spirometer is a tool that can help keep your lungs clear and active. This tool measures how well you are filling your lungs with each breath. Taking long deep breaths may help reverse or decrease the chance of developing breathing (pulmonary) problems (especially infection) following:  A long period of time when you are unable to move or be active. BEFORE THE PROCEDURE   If the spirometer includes an indicator to show your best effort, your nurse or respiratory therapist will set it to a desired goal.  If possible, sit up straight or lean slightly forward. Try not to slouch.  Hold the incentive spirometer in an upright position. INSTRUCTIONS FOR USE   1. Sit on the edge of your bed if possible, or sit up as far as you can in bed or on a chair. 2. Hold the incentive spirometer in an upright position. 3. Breathe out normally. 4. Place the mouthpiece in your mouth and seal your lips tightly around it. 5. Breathe in slowly and as deeply as possible, raising the piston or the ball toward the top of the column. 6. Hold your breath for 3-5 seconds or for as long as possible. Allow the piston or ball to fall to the bottom of the column. 7. Remove the mouthpiece from your mouth and breathe out normally. 8. Rest for  a few seconds and repeat Steps 1 through 7 at least 10 times every 1-2 hours when you are awake. Take your time and take a few normal breaths between deep breaths. 9. The spirometer may include an indicator to show your best effort. Use the indicator as a goal to work toward during each repetition. 10. After each set of 10 deep breaths, practice coughing to be sure your lungs are clear. If you have an incision (the cut made at the time of surgery), support your incision when coughing by placing a pillow or rolled up towels firmly against it. Once you are able to get out of bed, walk around indoors and cough well. You may stop using the incentive spirometer when instructed by your caregiver.  RISKS AND COMPLICATIONS  Take your time so you do not get dizzy or light-headed.  If you are in pain, you may need to take or ask for pain medication before doing incentive spirometry. It is harder to take a deep breath if you are having pain. AFTER USE  Rest and breathe slowly and easily.  It can be helpful to keep track of a log of your progress. Your caregiver can provide you with a simple table to help with this. If you are using the spirometer at home, follow these instructions: [place] IF:   You are having difficultly using the spirometer.  You have trouble using the spirometer as often as instructed.  Your pain medication is  not giving enough relief while using the spirometer.  You develop fever of 100.5 F (38.1 C) or higher. SEEK IMMEDIATE MEDICAL CARE IF:   You cough up bloody sputum that had not been present before.  You develop fever of 102 F (38.9 C) or greater.  You develop worsening pain at or near the incision site. MAKE SURE YOU:   Understand these instructions.  Will watch your condition.  Will get help right away if you are not doing well or get worse. Document Released: 12/21/2006 Document Revised: 11/02/2011 Document Reviewed: 02/21/2007 ExitCare Patient Information 2014 Exit[employer], [place].   ________________________________________________________________________  WHAT IS A BLOOD TRANSFUSION? Blood Transfusion Information  A transfusion is the replacement of blood or some of its parts. Blood is made up of multiple cells which provide different functions.  Red blood cells carry oxygen and are used for blood loss replacement.  White blood cells fight against infection.  Platelets control bleeding.  Plasma helps clot blood.  Other blood products are available for specialized needs, such as hemophilia or other clotting disorders. BEFORE THE TRANSFUSION  Who gives blood for transfusions?   Healthy volunteers who are fully evaluated to make sure their blood is safe. This is blood bank blood. Transfusion therapy is the safest it has ever been in the practice of medicine. Before blood is taken from a donor, a complete history is taken to make sure that person has no history of diseases nor engages in risky social behavior (examples are intravenous drug use or sexual activity with multiple partners). The donor's travel history is screened to minimize risk of transmitting infections, such as malaria. The donated blood is tested for signs of infectious diseases, such as HIV and hepatitis. The blood is then tested to be sure it is compatible with you in order to minimize the chance of a  transfusion reaction. If you or a relative donates blood, this is often done in anticipation of surgery and is not appropriate for emergency situations. It takes many days  to process the donated blood. RISKS AND COMPLICATIONS Although transfusion therapy is very safe and saves many lives, the main dangers of transfusion include:   Getting an infectious disease.  Developing a transfusion reaction. This is an allergic reaction to something in the blood you were given. Every precaution is taken to prevent this. The decision to have a blood transfusion has been considered carefully by your caregiver before blood is given. Blood is not given unless the benefits outweigh the risks. AFTER THE TRANSFUSION  Right after receiving a blood transfusion, you will usually feel much better and more energetic. This is especially true if your red blood cells have gotten low (anemic). The transfusion raises the level of the red blood cells which carry oxygen, and this usually causes an energy increase.  The nurse administering the transfusion will monitor you carefully for complications. HOME CARE INSTRUCTIONS  No special instructions are needed after a transfusion. You may find your energy is better. Speak with your caregiver about any limitations on activity for underlying diseases you may have. SEEK MEDICAL CARE IF:   Your condition is not improving after your transfusion.  You develop redness or irritation at the intravenous (IV) site. SEEK IMMEDIATE MEDICAL CARE IF:  Any of the following symptoms occur over the next 12 hours:  Shaking chills.  You have a temperature by mouth above 102 F (38.9 C), not controlled by medicine.  Chest, back, or muscle pain.  People around you feel you are not acting correctly or are confused.  Shortness of breath or difficulty breathing.  Dizziness and fainting.  You get a rash or develop hives.  You have a decrease in urine output.  Your urine turns a dark  color or changes to pink, red, or brown. Any of the following symptoms occur over the next 10 days:  You have a temperature by mouth above 102 F (38.9 C), not controlled by medicine.  Shortness of breath.  Weakness after normal activity.  The white part of the eye turns yellow (jaundice).  You have a decrease in the amount of urine or are urinating less often.  Your urine turns a dark color or changes to pink, red, or brown. Document Released: 08/07/2000 Document Revised: 11/02/2011 Document Reviewed: 03/26/2008 [facility] Patient Information 2014 [place], [place].  _______________________________________________________________________

## 2016-08-12 NOTE — Progress Notes (Signed)
01/07/2016- noted in EPIC- EKG 01/06/2016-noted in EPIC 2 view chest x-ray

## 2016-08-13 ENCOUNTER — Encounter (HOSPITAL_COMMUNITY)
Admission: RE | Admit: 2016-08-13 | Discharge: 2016-08-13 | Disposition: A | Discharge status: Home / Self Care | Payer: Managed Care, Other (non HMO) | Source: Ambulatory Visit

## 2016-08-13 NOTE — Patient Instructions (Addendum)
Allison Guerra  08/13/2016   Your procedure is scheduled on: 08/19/16  Report to [facility] Main  Entrance take [facility]  elevators to 3rd floor to  [place] at 1045  AM.  Call this number if you have problems the morning of surgery [phone] [place] AS DIRECTED BY OFFICE YOU MAY HAVE CLEAR LIQUIDS THE MORNING OF SURGERY UNTIL 0645 am--- THEN NOTHING BY MOUTH  Remember: ONLY 1 PERSON MAY GO WITH YOU TO SHORT STAY TO GET  READY MORNING OF YOUR SURGERY. BRING CPAP AND MASK WITH YOU TO HOSPITAL .     Take these medicines the morning of surgery with A SIP OF WATER: NO REGULAR MEDICATIONS MAY USE ALBUTEROL/ DULERA IF NEEDED                                You may not have any metal on your body including hair pins and              piercings  Do not wear jewelry, make-up, lotions, powders or perfumes, deodorant             Do not wear nail polish.  Do not shave  48 hours prior to surgery.              Men may shave face and neck.   Do not bring valuables to the hospital. [place].  Contacts, dentures or bridgework may not be worn into surgery.  Leave suitcase in the car. After surgery it may be brought to your room.      Special Instructions: N/A              Please read over the following fact sheets you were given: _____________________________________________________________________             [facility] - Preparing for Surgery Before surgery, you can play an important role.  Because skin is not sterile, your skin needs to be as free of germs as possible.  You can reduce the number of germs on your skin by washing with CHG (chlorahexidine gluconate) soap before surgery.  CHG is an antiseptic cleaner which kills germs and bonds with the skin to continue killing germs even after washing. Please DO NOT use if you have an allergy to CHG or antibacterial  soaps.  If your skin becomes reddened/irritated stop using the CHG and inform your nurse when you arrive at Short Stay. Do not shave (including legs and underarms) for at least 48 hours prior to the first CHG shower.  You may shave your face/neck. Please follow these instructions carefully:  1.  Shower with CHG Soap the night before surgery and the  morning of Surgery.  2.  If you choose to wash your hair, wash your hair first as usual with your  normal  shampoo.  3.  After you shampoo, rinse your hair and body thoroughly to remove the  shampoo.                           4.  Use CHG as you would any other liquid soap.  You can apply chg directly  to the skin  and wash                       Gently with a scrungie or clean washcloth.  5.  Apply the CHG Soap to your body ONLY FROM THE NECK DOWN.   Do not use on face/ open                           Wound or open sores. Avoid contact with eyes, ears mouth and genitals (private parts).                       Wash face,  Genitals (private parts) with your normal soap.             6.  Wash thoroughly, paying special attention to the area where your surgery  will be performed.  7.  Thoroughly rinse your body with warm water from the neck down.  8.  DO NOT shower/wash with your normal soap after using and rinsing off  the CHG Soap.                9.  Pat yourself dry with a clean towel.            10.  Wear clean pajamas.            11.  Place clean sheets on your bed the night of your first shower and do not  sleep with pets. Day of Surgery : Do not apply any lotions/deodorants the morning of surgery.  Please wear clean clothes to the hospital/surgery center.  FAILURE TO FOLLOW THESE INSTRUCTIONS MAY RESULT IN THE CANCELLATION OF YOUR SURGERY PATIENT SIGNATURE_________________________________  NURSE SIGNATURE__________________________________  ________________________________________________________________________    CLEAR LIQUID DIET   Foods  Allowed                                                                     Foods Excluded  Coffee and tea, regular and decaf                             liquids that you cannot  Plain Jell-O in any flavor                                             see through such as: Fruit ices (not with fruit pulp)                                     milk, soups, orange juice  Iced Popsicles                                    All solid food Carbonated beverages, regular and diet  Cranberry, grape and apple juices Sports drinks like Gatorade Lightly seasoned clear broth or consume(fat free) Sugar, honey syrup  Sample Menu Breakfast                                Lunch                                     Supper Cranberry juice                    Beef broth                            Chicken broth Jell-O                                     Grape juice                           Apple juice Coffee or tea                        Jell-O                                      Popsicle                                                Coffee or tea                        Coffee or tea  _____________________________________________________________________

## 2016-08-14 ENCOUNTER — Encounter (HOSPITAL_COMMUNITY): Payer: Self-pay

## 2016-08-14 ENCOUNTER — Encounter (INDEPENDENT_AMBULATORY_CARE_PROVIDER_SITE_OTHER): Payer: Self-pay

## 2016-08-14 ENCOUNTER — Encounter (HOSPITAL_COMMUNITY)
Admission: RE | Admit: 2016-08-14 | Discharge: 2016-08-14 | Disposition: A | Discharge status: Home / Self Care | Payer: Managed Care, Other (non HMO) | Source: Ambulatory Visit | Attending: Urology | Admitting: Urology

## 2016-08-14 DIAGNOSIS — Z01812 Encounter for preprocedural laboratory examination: Secondary | ICD-10-CM | POA: Insufficient documentation

## 2016-08-14 DIAGNOSIS — Z0183 Encounter for blood typing: Secondary | ICD-10-CM | POA: Insufficient documentation

## 2016-08-14 DIAGNOSIS — N2889 Other specified disorders of kidney and ureter: Secondary | ICD-10-CM | POA: Insufficient documentation

## 2016-08-14 HISTORY — DX: Headache, unspecified: R51.9

## 2016-08-14 HISTORY — DX: Allergy status to unspecified drugs, medicaments and biological substances: Z88.9

## 2016-08-14 HISTORY — DX: Depression, unspecified: F32.A

## 2016-08-14 HISTORY — DX: Pneumonia, unspecified organism: J18.9

## 2016-08-14 HISTORY — DX: Gastro-esophageal reflux disease without esophagitis: K21.9

## 2016-08-14 HISTORY — DX: Headache: R51

## 2016-08-14 HISTORY — DX: Other specified personal risk factors, not elsewhere classified: Z91.89

## 2016-08-14 HISTORY — DX: Major depressive disorder, single episode, unspecified: F32.9

## 2016-08-14 LAB — CBC
HEMATOCRIT: 36.8 % (ref 36.0–46.0)
HEMOGLOBIN: 12.1 g/dL (ref 12.0–15.0)
MCH: 27.9 pg (ref 26.0–34.0)
MCHC: 32.9 g/dL (ref 30.0–36.0)
MCV: 85 fL (ref 78.0–100.0)
PLATELETS: 297 10*3/uL (ref 150–400)
RBC: 4.33 MIL/uL (ref 3.87–5.11)
RDW: 16 % — ABNORMAL HIGH (ref 11.5–15.5)
WBC: 6.2 10*3/uL (ref 4.0–10.5)

## 2016-08-14 LAB — BASIC METABOLIC PANEL
ANION GAP: 10 (ref 5–15)
BUN: 13 mg/dL (ref 6–20)
CHLORIDE: 100 mmol/L — AB (ref 101–111)
CO2: 30 mmol/L (ref 22–32)
CREATININE: 0.74 mg/dL (ref 0.44–1.00)
Calcium: 9.6 mg/dL (ref 8.9–10.3)
GFR calc non Af Amer: >60 mL/min (ref >60)
Glucose, Bld: 105 mg/dL — ABNORMAL HIGH (ref 65–99)
POTASSIUM: 3.5 mmol/L (ref 3.5–5.1)
SODIUM: 140 mmol/L (ref 135–145)

## 2016-08-14 LAB — ABO/RH: ABO/RH(D): A POS

## 2016-08-14 NOTE — Progress Notes (Signed)
eccho 5/17 chart, ekg with lov dr sun 11/17 on chart

## 2016-08-15 LAB — HEMOGLOBIN A1C
Hgb A1c MFr Bld: 6 % — ABNORMAL HIGH (ref 4.8–5.6)
Mean Plasma Glucose: 126 mg/dL

## 2016-08-19 ENCOUNTER — Inpatient Hospital Stay (HOSPITAL_COMMUNITY)
Admission: RE | Admit: 2016-08-19 | Discharge: 2016-08-21 | DRG: 657 | Disposition: A | Discharge status: Home / Self Care | Payer: Managed Care, Other (non HMO) | Source: Ambulatory Visit | Attending: Urology | Admitting: Urology

## 2016-08-19 ENCOUNTER — Inpatient Hospital Stay (HOSPITAL_COMMUNITY): Payer: Managed Care, Other (non HMO) | Admitting: Certified Registered"

## 2016-08-19 ENCOUNTER — Encounter (HOSPITAL_COMMUNITY): Payer: Self-pay | Admitting: *Deleted

## 2016-08-19 ENCOUNTER — Encounter (HOSPITAL_COMMUNITY)
Admission: RE | Disposition: A | Discharge status: Home / Self Care | Payer: Self-pay | Source: Ambulatory Visit | Attending: Urology

## 2016-08-19 DIAGNOSIS — C642 Malignant neoplasm of left kidney, except renal pelvis: Principal | ICD-10-CM | POA: Diagnosis present

## 2016-08-19 DIAGNOSIS — Z8249 Family history of ischemic heart disease and other diseases of the circulatory system: Secondary | ICD-10-CM | POA: Diagnosis not present

## 2016-08-19 DIAGNOSIS — Z87891 Personal history of nicotine dependence: Secondary | ICD-10-CM

## 2016-08-19 DIAGNOSIS — Z6841 Body Mass Index (BMI) 40.0 and over, adult: Secondary | ICD-10-CM | POA: Diagnosis not present

## 2016-08-19 DIAGNOSIS — Z79899 Other long term (current) drug therapy: Secondary | ICD-10-CM

## 2016-08-19 DIAGNOSIS — N2889 Other specified disorders of kidney and ureter: Secondary | ICD-10-CM | POA: Diagnosis present

## 2016-08-19 DIAGNOSIS — J45909 Unspecified asthma, uncomplicated: Secondary | ICD-10-CM | POA: Diagnosis present

## 2016-08-19 DIAGNOSIS — E119 Type 2 diabetes mellitus without complications: Secondary | ICD-10-CM | POA: Diagnosis present

## 2016-08-19 DIAGNOSIS — N736 Female pelvic peritoneal adhesions (postinfective): Secondary | ICD-10-CM | POA: Diagnosis present

## 2016-08-19 DIAGNOSIS — K219 Gastro-esophageal reflux disease without esophagitis: Secondary | ICD-10-CM | POA: Diagnosis present

## 2016-08-19 DIAGNOSIS — G4733 Obstructive sleep apnea (adult) (pediatric): Secondary | ICD-10-CM | POA: Diagnosis present

## 2016-08-19 DIAGNOSIS — I10 Essential (primary) hypertension: Secondary | ICD-10-CM | POA: Diagnosis present

## 2016-08-19 DIAGNOSIS — R3129 Other microscopic hematuria: Secondary | ICD-10-CM | POA: Diagnosis present

## 2016-08-19 HISTORY — PX: ROBOTIC ASSITED PARTIAL NEPHRECTOMY: SHX6087

## 2016-08-19 LAB — HEMOGLOBIN AND HEMATOCRIT, BLOOD
HCT: 35.2 % — ABNORMAL LOW (ref 36.0–46.0)
Hemoglobin: 11.4 g/dL — ABNORMAL LOW (ref 12.0–15.0)

## 2016-08-19 LAB — TYPE AND SCREEN
ABO/RH(D): A POS
Antibody Screen: NEGATIVE

## 2016-08-19 SURGERY — NEPHRECTOMY, PARTIAL, ROBOT-ASSISTED
Anesthesia: General | Laterality: Left

## 2016-08-19 MED ORDER — LACTATED RINGERS IV SOLN
INTRAVENOUS | Status: DC | PRN
Start: 2016-08-19 — End: 2016-08-19
  Administered 2016-08-19 (×2): via INTRAVENOUS

## 2016-08-19 MED ORDER — STERILE WATER FOR IRRIGATION IR SOLN
Status: DC | PRN
  Administered 2016-08-19: 1000 mL

## 2016-08-19 MED ORDER — BUPIVACAINE LIPOSOME 1.3 % IJ SUSP
20.0000 mL | Freq: Once | INTRAMUSCULAR | Status: AC
  Administered 2016-08-19: 20 mL
  Filled 2016-08-19: qty 20

## 2016-08-19 MED ORDER — DIPHENHYDRAMINE HCL 12.5 MG/5ML PO ELIX: 12.5000 mg | ORAL_SOLUTION | Freq: Four times a day (QID) | ORAL | Status: DC | PRN

## 2016-08-19 MED ORDER — SODIUM CHLORIDE 0.9 % IJ SOLN
INTRAMUSCULAR | Status: AC
  Filled 2016-08-19: qty 20

## 2016-08-19 MED ORDER — FENTANYL CITRATE (PF) 100 MCG/2ML IJ SOLN
INTRAMUSCULAR | Status: AC
  Filled 2016-08-19: qty 2

## 2016-08-19 MED ORDER — GLYCOPYRROLATE 0.2 MG/ML IJ SOLN
INTRAMUSCULAR | Status: DC | PRN
  Administered 2016-08-19: 0.2 mg via INTRAVENOUS

## 2016-08-19 MED ORDER — SODIUM CHLORIDE 0.9 % IR SOLN
Status: DC | PRN
  Administered 2016-08-19: 2000 mL

## 2016-08-19 MED ORDER — ONDANSETRON HCL 4 MG/2ML IJ SOLN
INTRAMUSCULAR | Status: AC
  Filled 2016-08-19: qty 2

## 2016-08-19 MED ORDER — CEFAZOLIN SODIUM-DEXTROSE 2-4 GM/100ML-% IV SOLN
INTRAVENOUS | Status: AC
  Filled 2016-08-19: qty 100

## 2016-08-19 MED ORDER — ONDANSETRON HCL 4 MG/2ML IJ SOLN
INTRAMUSCULAR | Status: DC | PRN
Start: 2016-08-19 — End: 2016-08-19
  Administered 2016-08-19: 4 mg via INTRAVENOUS

## 2016-08-19 MED ORDER — HYDROMORPHONE HCL 1 MG/ML IJ SOLN
0.2500 mg | INTRAMUSCULAR | Status: DC | PRN
  Administered 2016-08-19 (×4): 0.5 mg via INTRAVENOUS

## 2016-08-19 MED ORDER — VALSARTAN-HYDROCHLOROTHIAZIDE 160-25 MG PO TABS: 1.0000 | ORAL_TABLET | Freq: Every day | ORAL | Status: DC

## 2016-08-19 MED ORDER — MIDAZOLAM HCL 2 MG/2ML IJ SOLN
INTRAMUSCULAR | Status: AC
  Filled 2016-08-19: qty 2

## 2016-08-19 MED ORDER — DEXTROSE-NACL 5-0.45 % IV SOLN
INTRAVENOUS | Status: DC
  Administered 2016-08-19 – 2016-08-20 (×4): via INTRAVENOUS

## 2016-08-19 MED ORDER — MAGNESIUM CITRATE PO SOLN: 1.0000 | Freq: Once | ORAL | Status: DC

## 2016-08-19 MED ORDER — TRAMADOL HCL 50 MG PO TABS: 50.0000 mg | ORAL_TABLET | Freq: Four times a day (QID) | ORAL | 0 refills | Status: DC | PRN

## 2016-08-19 MED ORDER — FENTANYL CITRATE (PF) 250 MCG/5ML IJ SOLN
INTRAMUSCULAR | Status: DC | PRN
  Administered 2016-08-19 (×3): 50 ug via INTRAVENOUS
  Administered 2016-08-19 (×2): 100 ug via INTRAVENOUS

## 2016-08-19 MED ORDER — MOMETASONE FURO-FORMOTEROL FUM 100-5 MCG/ACT IN AERO
2.0000 | INHALATION_SPRAY | Freq: Two times a day (BID) | RESPIRATORY_TRACT | Status: DC
  Administered 2016-08-20: 2 via RESPIRATORY_TRACT
  Filled 2016-08-19: qty 8.8

## 2016-08-19 MED ORDER — PROPOFOL 10 MG/ML IV BOLUS
INTRAVENOUS | Status: AC
  Filled 2016-08-19: qty 20

## 2016-08-19 MED ORDER — DIPHENHYDRAMINE HCL 50 MG/ML IJ SOLN
INTRAMUSCULAR | Status: DC | PRN
  Administered 2016-08-19: 12.5 mg via INTRAVENOUS

## 2016-08-19 MED ORDER — HYDROMORPHONE HCL 1 MG/ML IJ SOLN
INTRAMUSCULAR | Status: AC
  Filled 2016-08-19: qty 1

## 2016-08-19 MED ORDER — PROMETHAZINE HCL 25 MG/ML IJ SOLN
INTRAMUSCULAR | Status: AC
  Filled 2016-08-19: qty 1

## 2016-08-19 MED ORDER — PROMETHAZINE HCL 25 MG/ML IJ SOLN
6.2500 mg | Freq: Once | INTRAMUSCULAR | Status: AC
  Administered 2016-08-19: 6.25 mg via INTRAVENOUS

## 2016-08-19 MED ORDER — ACETAMINOPHEN 500 MG PO TABS
1000.0000 mg | ORAL_TABLET | Freq: Four times a day (QID) | ORAL | Status: AC
  Administered 2016-08-19 – 2016-08-20 (×4): 1000 mg via ORAL
  Filled 2016-08-19 (×4): qty 2

## 2016-08-19 MED ORDER — ROCURONIUM BROMIDE 10 MG/ML (PF) SYRINGE
PREFILLED_SYRINGE | INTRAVENOUS | Status: DC | PRN
  Administered 2016-08-19 (×2): 10 mg via INTRAVENOUS
  Administered 2016-08-19: 50 mg via INTRAVENOUS

## 2016-08-19 MED ORDER — SUGAMMADEX SODIUM 200 MG/2ML IV SOLN
INTRAVENOUS | Status: DC | PRN
  Administered 2016-08-19: 200 mg via INTRAVENOUS

## 2016-08-19 MED ORDER — HYDROCHLOROTHIAZIDE 25 MG PO TABS
25.0000 mg | ORAL_TABLET | Freq: Every day | ORAL | Status: DC
  Administered 2016-08-19 – 2016-08-20 (×2): 25 mg via ORAL
  Filled 2016-08-19 (×2): qty 1

## 2016-08-19 MED ORDER — MIDAZOLAM HCL 2 MG/2ML IJ SOLN
INTRAMUSCULAR | Status: DC | PRN
  Administered 2016-08-19: 2 mg via INTRAVENOUS

## 2016-08-19 MED ORDER — HYDROMORPHONE HCL 1 MG/ML IJ SOLN
0.5000 mg | INTRAMUSCULAR | Status: DC | PRN
  Administered 2016-08-19: 0.5 mg via INTRAVENOUS
  Administered 2016-08-19 – 2016-08-20 (×3): 1 mg via INTRAVENOUS
  Administered 2016-08-20: 0.5 mg via INTRAVENOUS
  Filled 2016-08-19 (×5): qty 1

## 2016-08-19 MED ORDER — HEMOSTATIC AGENTS (NO CHARGE) OPTIME
TOPICAL | Status: DC | PRN
  Administered 2016-08-19: 1 via TOPICAL

## 2016-08-19 MED ORDER — PROPOFOL 10 MG/ML IV BOLUS
INTRAVENOUS | Status: DC | PRN
  Administered 2016-08-19: 200 mg via INTRAVENOUS

## 2016-08-19 MED ORDER — LIDOCAINE 2% (20 MG/ML) 5 ML SYRINGE
INTRAMUSCULAR | Status: DC | PRN
  Administered 2016-08-19: 60 mg via INTRAVENOUS

## 2016-08-19 MED ORDER — CEFAZOLIN SODIUM-DEXTROSE 2-4 GM/100ML-% IV SOLN
2.0000 g | INTRAVENOUS | Status: AC
  Administered 2016-08-19: 2 g via INTRAVENOUS

## 2016-08-19 MED ORDER — FENTANYL CITRATE (PF) 250 MCG/5ML IJ SOLN
INTRAMUSCULAR | Status: AC
  Filled 2016-08-19: qty 5

## 2016-08-19 MED ORDER — ALBUTEROL SULFATE (2.5 MG/3ML) 0.083% IN NEBU
2.5000 mg | INHALATION_SOLUTION | Freq: Four times a day (QID) | RESPIRATORY_TRACT | Status: DC | PRN
  Administered 2016-08-19 – 2016-08-20 (×3): 2.5 mg via RESPIRATORY_TRACT
  Filled 2016-08-19 (×3): qty 3

## 2016-08-19 MED ORDER — OXYCODONE HCL 5 MG PO TABS
5.0000 mg | ORAL_TABLET | ORAL | Status: DC | PRN
  Administered 2016-08-19 – 2016-08-21 (×9): 5 mg via ORAL
  Filled 2016-08-19 (×9): qty 1

## 2016-08-19 MED ORDER — ONDANSETRON HCL 4 MG/2ML IJ SOLN
4.0000 mg | INTRAMUSCULAR | Status: DC | PRN
  Administered 2016-08-19: 4 mg via INTRAVENOUS
  Filled 2016-08-19: qty 2

## 2016-08-19 MED ORDER — PANTOPRAZOLE SODIUM 40 MG PO TBEC
40.0000 mg | DELAYED_RELEASE_TABLET | Freq: Every day | ORAL | Status: DC
  Administered 2016-08-19 – 2016-08-20 (×2): 40 mg via ORAL
  Filled 2016-08-19 (×2): qty 1

## 2016-08-19 MED ORDER — INFLUENZA VAC SPLIT QUAD 0.5 ML IM SUSY: 0.5000 mL | PREFILLED_SYRINGE | INTRAMUSCULAR | Status: DC

## 2016-08-19 MED ORDER — ALBUTEROL SULFATE (2.5 MG/3ML) 0.083% IN NEBU: 2.5000 mg | INHALATION_SOLUTION | Freq: Four times a day (QID) | RESPIRATORY_TRACT | Status: DC | PRN

## 2016-08-19 MED ORDER — SODIUM CHLORIDE 0.9 % IJ SOLN
INTRAMUSCULAR | Status: DC | PRN
  Administered 2016-08-19: 20 mL

## 2016-08-19 MED ORDER — DIPHENHYDRAMINE HCL 50 MG/ML IJ SOLN
12.5000 mg | Freq: Four times a day (QID) | INTRAMUSCULAR | Status: DC | PRN
  Administered 2016-08-20: 12.5 mg via INTRAVENOUS
  Filled 2016-08-19: qty 1

## 2016-08-19 MED ORDER — SUGAMMADEX SODIUM 200 MG/2ML IV SOLN
INTRAVENOUS | Status: AC
Start: 2016-08-19 — End: 2016-08-19
  Filled 2016-08-19: qty 2

## 2016-08-19 MED ORDER — IRBESARTAN 150 MG PO TABS
150.0000 mg | ORAL_TABLET | Freq: Every day | ORAL | Status: DC
  Administered 2016-08-19 – 2016-08-20 (×2): 150 mg via ORAL
  Filled 2016-08-19 (×2): qty 1

## 2016-08-19 SURGICAL SUPPLY — 70 items
APPLICATOR SURGIFLO ENDO (HEMOSTASIS) ×3 IMPLANT
CHLORAPREP W/TINT 26ML (MISCELLANEOUS) ×3 IMPLANT
CLIP LIGATING HEM O LOK PURPLE (MISCELLANEOUS) ×3 IMPLANT
CLIP LIGATING HEMO LOK XL GOLD (MISCELLANEOUS) IMPLANT
CLIP LIGATING HEMO O LOK GREEN (MISCELLANEOUS) ×6 IMPLANT
CLIP SUT LAPRA TY ABSORB (SUTURE) ×3 IMPLANT
COVER SURGICAL LIGHT HANDLE (MISCELLANEOUS) ×3 IMPLANT
COVER TIP SHEARS 8 DVNC (MISCELLANEOUS) ×1 IMPLANT
COVER TIP SHEARS 8MM DA VINCI (MISCELLANEOUS) ×2
DECANTER SPIKE VIAL GLASS SM (MISCELLANEOUS) ×3 IMPLANT
DERMABOND ADVANCED (GAUZE/BANDAGES/DRESSINGS) ×2
DERMABOND ADVANCED .7 DNX12 (GAUZE/BANDAGES/DRESSINGS) ×1 IMPLANT
DRAIN CHANNEL 15F RND FF 3/16 (WOUND CARE) ×3 IMPLANT
DRAPE ARM DVNC X/XI (DISPOSABLE) ×4 IMPLANT
DRAPE COLUMN DVNC XI (DISPOSABLE) ×1 IMPLANT
DRAPE DA VINCI XI ARM (DISPOSABLE) ×8
DRAPE DA VINCI XI COLUMN (DISPOSABLE) ×2
DRAPE INCISE IOBAN 66X45 STRL (DRAPES) ×3 IMPLANT
DRAPE LAPAROSCOPIC ABDOMINAL (DRAPES) ×3 IMPLANT
DRAPE SHEET LG 3/4 BI-LAMINATE (DRAPES) ×3 IMPLANT
DRSG TEGADERM 2-3/8X2-3/4 SM (GAUZE/BANDAGES/DRESSINGS) ×3 IMPLANT
ELECT PENCIL ROCKER SW 15FT (MISCELLANEOUS) ×3 IMPLANT
ELECT REM PT RETURN 9FT ADLT (ELECTROSURGICAL) ×3
ELECTRODE REM PT RTRN 9FT ADLT (ELECTROSURGICAL) ×1 IMPLANT
EVACUATOR SILICONE 100CC (DRAIN) ×3 IMPLANT
GAUZE SPONGE 2X2 8PLY STRL LF (GAUZE/BANDAGES/DRESSINGS) ×1 IMPLANT
GLOVE BIO SURGEON STRL SZ 6.5 (GLOVE) ×2 IMPLANT
GLOVE BIO SURGEONS STRL SZ 6.5 (GLOVE) ×1
GLOVE BIOGEL M STRL SZ7.5 (GLOVE) ×6 IMPLANT
GOWN STRL REUS W/TWL LRG LVL3 (GOWN DISPOSABLE) ×6 IMPLANT
HEMOSTAT SURGICEL 4X8 (HEMOSTASIS) ×3 IMPLANT
IRRIG SUCT STRYKERFLOW 2 WTIP (MISCELLANEOUS)
IRRIGATION SUCT STRKRFLW 2 WTP (MISCELLANEOUS) IMPLANT
KIT BASIN OR (CUSTOM PROCEDURE TRAY) ×3 IMPLANT
LOOP VESSEL MAXI BLUE (MISCELLANEOUS) ×3 IMPLANT
MARKER SKIN DUAL TIP RULER LAB (MISCELLANEOUS) ×3 IMPLANT
NEEDLE INSUFFLATION 14GA 120MM (NEEDLE) ×3 IMPLANT
NS IRRIG 1000ML POUR BTL (IV SOLUTION) ×3 IMPLANT
PORT ACCESS TROCAR AIRSEAL 12 (TROCAR) ×1 IMPLANT
PORT ACCESS TROCAR AIRSEAL 5M (TROCAR) ×2
POSITIONER SURGICAL ARM (MISCELLANEOUS) ×6 IMPLANT
POUCH SPECIMEN RETRIEVAL 10MM (ENDOMECHANICALS) ×3 IMPLANT
RELOAD STAPLER WHITE 60MM (STAPLE) IMPLANT
SET IRRIG Y TYPE TUR BLADDER L (SET/KITS/TRAYS/PACK) ×3 IMPLANT
SET TRI-LUMEN FLTR TB AIRSEAL (TUBING) ×3 IMPLANT
SOLUTION ELECTROLUBE (MISCELLANEOUS) ×3 IMPLANT
SPONGE GAUZE 2X2 STER 10/PKG (GAUZE/BANDAGES/DRESSINGS) ×2
SPONGE LAP 4X18 X RAY DECT (DISPOSABLE) ×3 IMPLANT
STAPLE ECHEON FLEX 60 POW ENDO (STAPLE) IMPLANT
STAPLER RELOAD WHITE 60MM (STAPLE)
SURGIFLO W/THROMBIN 8M KIT (HEMOSTASIS) ×3 IMPLANT
SUT ETHILON 3 0 PS 1 (SUTURE) ×3 IMPLANT
SUT MNCRL AB 4-0 PS2 18 (SUTURE) ×6 IMPLANT
SUT PDS AB 1 CT1 27 (SUTURE) ×6 IMPLANT
SUT V-LOC BARB 180 2/0GR6 GS22 (SUTURE)
SUT VIC AB 0 CT1 27 (SUTURE) ×8
SUT VIC AB 0 CT1 27XBRD ANTBC (SUTURE) ×4 IMPLANT
SUT VIC AB 2-0 SH 27 (SUTURE) ×4
SUT VIC AB 2-0 SH 27X BRD (SUTURE) ×2 IMPLANT
SUT VLOC BARB 180 ABS3/0GR12 (SUTURE) ×3
SUTURE V-LC BRB 180 2/0GR6GS22 (SUTURE) IMPLANT
SUTURE VLOC BRB 180 ABS3/0GR12 (SUTURE) ×1 IMPLANT
TAPE STRIPS DRAPE STRL (GAUZE/BANDAGES/DRESSINGS) ×3 IMPLANT
TOWEL OR 17X26 10 PK STRL BLUE (TOWEL DISPOSABLE) ×6 IMPLANT
TOWEL OR NON WOVEN STRL DISP B (DISPOSABLE) ×3 IMPLANT
TRAY FOLEY W/METER SILVER 16FR (SET/KITS/TRAYS/PACK) ×3 IMPLANT
TRAY LAPAROSCOPIC (CUSTOM PROCEDURE TRAY) ×3 IMPLANT
TROCAR BLADELESS OPT 5 100 (ENDOMECHANICALS) IMPLANT
TROCAR XCEL 12X100 BLDLESS (ENDOMECHANICALS) ×3 IMPLANT
WATER STERILE IRR 1500ML POUR (IV SOLUTION) ×6 IMPLANT

## 2016-08-19 NOTE — Brief Op Note (Signed)
08/19/2016  2:47 PM  PATIENT:  Allison Guerra  50 y.o. female  PRE-OPERATIVE DIAGNOSIS:  LEFT RENAL MASS  POST-OPERATIVE DIAGNOSIS:  LEFT RENAL MASS  PROCEDURE:  Procedure(s): XI ROBOTIC ASSITED PARTIAL NEPHRECTOMY WITH ULTRASOUND; LYSIS OF ADHESIONS; WITH FLEXIBLE CYSTOSCOPY (Left)  SURGEON:  Surgeon(s) and Role:    * Alexis Frock, MD - Primary  PHYSICIAN ASSISTANT:   ASSISTANTS: Debbrah Alar, PA   ANESTHESIA:   local and general  EBL:  Total I/O In: 1000 [I.V.:1000] Out: 50 [Blood:50]  BLOOD ADMINISTERED:none  DRAINS: 1 - JP to bulb, 2 - foley to gravity   LOCAL MEDICATIONS USED:  MARCAINE     SPECIMEN:  Source of Specimen:  small left renal mass  DISPOSITION OF SPECIMEN:  PATHOLOGY  COUNTS:  YES  TOURNIQUET:  * No tourniquets in log *  DICTATION: .Other Dictation: Dictation Number [id number]  PLAN OF CARE: Admit to inpatient   PATIENT DISPOSITION:  PACU - hemodynamically stable.   Delay start of Pharmacological VTE agent (>24hrs) due to surgical blood loss or risk of bleeding: yes

## 2016-08-19 NOTE — H&P (Signed)
Allison Guerra is an 50 y.o. female.    Chief Complaint: Pre-op LEFT partial nephrectomy  HPI:   1 - Left Renal Mass - 1.3cm left lower anterior enhancing mass by CT Urogram 02/2016. 90% endophytic. 1 artery / 1 vein left renovasular anatomy.   2 - Microscopic Hematuria - blood on UA noted by PCP x several 2017. CT urogram with small left mass as per above. Remote <10PY smoker. No chronic solvent exposure.   PMH sig for Obesity / OSA, DM2, Fibroids (still present on CT 2017, prior myomectomy). Her PCP is Sandi Mariscal MD with [facility].   Today "Allison Guerra" is seen to proceed with LEFT robotic partial nephrectomy for small mass as well as cystoscopy to complete hematuria evaluation.    Past Medical History:  Diagnosis Date  . Anxiety   . Asthma   . Depression   . GERD (gastroesophageal reflux disease)   . H/O multiple allergies    states "seasonal allergies last all year"  . Headache   . Hypertension   . Pneumonia   . Sleep apnea   . Vocal cord dysfunction     Past Surgical History:  Procedure Laterality Date  . PARTIAL HYSTERECTOMY     08/14/16-  states unsure if uterus was removed  . uterine ablation    . UTERINE FIBROID SURGERY      Family History  Problem Relation Age of Onset  . Cancer - Lung Mother   . Hypertension Father   . Hypertension Sister    Social History:  reports that she quit smoking about 22 years ago. Her smoking use included Cigarettes. She smoked 0.50 packs per day. She has never used smokeless tobacco. She reports that she drinks alcohol. She reports that she does not use drugs.  Allergies:  Allergies  Allergen Reactions  . Hydromet [Hydrocodone-Homatropine] Nausea And Vomiting  . Reglan [Metoclopramide] Other (See Comments)    Made her aggressive, made her feel crazy  . Prednisone Anxiety    Jittery    No prescriptions prior to admission.    No results found for this or any previous visit (from the past 48 hour(s)). No results  found.  Review of Systems  Constitutional: Negative.  Negative for chills and fever.  HENT: Negative.   Eyes: Negative.   Respiratory: Negative.   Cardiovascular: Negative.   Gastrointestinal: Negative.   Genitourinary: Negative.  Negative for hematuria.  Musculoskeletal: Negative.   Skin: Negative.   Neurological: Negative.   Endo/Heme/Allergies: Negative.   Psychiatric/Behavioral: Negative.     There were no vitals taken for this visit. Physical Exam  Constitutional: She appears well-developed.  HENT:  Head: Normocephalic.  Eyes: Pupils are equal, round, and reactive to light.  Neck: Normal range of motion.  Cardiovascular: Normal rate.   Respiratory: Effort normal.  GI: Soft.  Genitourinary:  Genitourinary Comments: No CVAT  Musculoskeletal: Normal range of motion.  Neurological: She is alert.  Skin: Skin is warm.  Psychiatric: She has a normal mood and affect.     Assessment/Plan  Proceed as planned with robotic LEFT partial nephrectomy and flexible cystoscopy. Risks, benefits, alternatives (including surveillance of mass), and expected peri-op course discussed previously and reiterated today.    Alexis Frock, MD 08/19/2016, 7:24 AM

## 2016-08-19 NOTE — Discharge Instructions (Signed)

## 2016-08-19 NOTE — Transfer of Care (Signed)
Immediate Anesthesia Transfer of Care Note  Patient: Allison Guerra  Procedure(s) Performed: Procedure(s): XI ROBOTIC ASSITED PARTIAL NEPHRECTOMY WITH ULTRASOUND; LYSIS OF ADHESIONS; WITH FLEXIBLE CYSTOSCOPY (Left)  Patient Location: PACU  Anesthesia Type:General  Level of Consciousness: awake, alert  and oriented  Airway & Oxygen Therapy: Patient Spontanous Breathing and Patient connected to face mask oxygen  Post-op Assessment: Report given to RN and Post -op Vital signs reviewed and stable  Post vital signs: Reviewed and stable  Last Vitals:  Vitals:   08/19/16 1059 08/19/16 1500  BP: 129/75   Pulse: 80   Resp: 16   Temp: 37.2 C (P) 36.6 C    Last Pain:  Vitals:   08/19/16 1059  TempSrc: Oral      Patients Stated Pain Goal: 3 ([id number])  Complications: No apparent anesthesia complications

## 2016-08-19 NOTE — Anesthesia Postprocedure Evaluation (Signed)
Anesthesia Post Note  Patient: Allison Guerra  Procedure(s) Performed: Procedure(s) (LRB): XI ROBOTIC ASSITED PARTIAL NEPHRECTOMY WITH ULTRASOUND; LYSIS OF ADHESIONS; WITH FLEXIBLE CYSTOSCOPY (Left)  Patient location during evaluation: PACU Anesthesia Type: General Level of consciousness: awake and alert Pain management: pain level controlled Vital Signs Assessment: post-procedure vital signs reviewed and stable Respiratory status: spontaneous breathing, nonlabored ventilation, respiratory function stable and patient connected to nasal cannula oxygen Cardiovascular status: blood pressure returned to baseline and stable Postop Assessment: no signs of nausea or vomiting Anesthetic complications: no       Last Vitals:  Vitals:   08/19/16 1600 08/19/16 1615  BP: (!) 171/104 138/82  Pulse: 83 61  Resp: 17 11  Temp: 36.4 C     Last Pain:  Vitals:   08/19/16 1615  TempSrc:   PainSc: 8                  Hazely Sealey,W. EDMOND

## 2016-08-19 NOTE — Progress Notes (Signed)
Pt refused CPAP qhs due to abdominal pain.  RT will continue to monitor as needed.

## 2016-08-19 NOTE — Anesthesia Preprocedure Evaluation (Addendum)
Anesthesia Evaluation  Patient identified by MRN, date of birth, ID band Patient awake    Reviewed: Allergy & Precautions, H&P , NPO status , Patient's Chart, lab work & pertinent test results  Airway Mallampati: II  TM Distance: >3 FB Neck ROM: Full    Dental no notable dental hx. (+) Teeth Intact, Dental Advisory Given, Chipped   Pulmonary asthma , sleep apnea and Continuous Positive Airway Pressure Ventilation , former smoker,    Pulmonary exam normal breath sounds clear to auscultation       Cardiovascular hypertension, Pt. on medications negative cardio ROS   Rhythm:Regular Rate:Normal     Neuro/Psych  Headaches, Anxiety Depression    GI/Hepatic Neg liver ROS, GERD  Medicated and Controlled,  Endo/Other  Morbid obesity  Renal/GU negative Renal ROS  negative genitourinary   Musculoskeletal   Abdominal   Peds  Hematology negative hematology ROS (+)   Anesthesia Other Findings   Reproductive/Obstetrics negative OB ROS                           Anesthesia Physical Anesthesia Plan  ASA: III  Anesthesia Plan: General   Post-op Pain Management:    Induction: Intravenous  Airway Management Planned: Oral ETT  Additional Equipment:   Intra-op Plan:   Post-operative Plan: Extubation in OR  Informed Consent: I have reviewed the patients History and Physical, chart, labs and discussed the procedure including the risks, benefits and alternatives for the proposed anesthesia with the patient or authorized representative who has indicated his/her understanding and acceptance.   Dental advisory given  Plan Discussed with: CRNA  Anesthesia Plan Comments:         Anesthesia Quick Evaluation

## 2016-08-19 NOTE — Anesthesia Procedure Notes (Signed)
Procedure Name: Intubation Date/Time: 08/19/2016 12:27 PM Performed by: Cynda Familia Pre-anesthesia Checklist: Patient identified, Emergency Drugs available, Suction available and Patient being monitored Patient Re-evaluated:Patient Re-evaluated prior to inductionOxygen Delivery Method: Circle System Utilized Preoxygenation: Pre-oxygenation with 100% oxygen Intubation Type: IV induction Ventilation: Mask ventilation without difficulty Laryngoscope Size: Miller and 2 Grade View: Grade I Tube type: Oral Tube size: 6.5 mm Number of attempts: 1 Airway Equipment and Method: Stylet Placement Confirmation: ETT inserted through vocal cords under direct vision,  positive ETCO2 and breath sounds checked- equal and bilateral Secured at: 20 cm Tube secured with: Tape Dental Injury: Teeth and Oropharynx as per pre-operative assessment  Comments: Smooth IV induction--- Ola Spurr--- intubation AM CRNA atraumatic--- teeth and mouth as preop--- small tube due to vocal cord dysfunction--- bilat BS Fitzgerald--- pt complains of hoarseness preop

## 2016-08-20 LAB — BASIC METABOLIC PANEL
Anion gap: 8 (ref 5–15)
BUN: 13 mg/dL (ref 6–20)
CHLORIDE: 101 mmol/L (ref 101–111)
CO2: 26 mmol/L (ref 22–32)
Calcium: 8.7 mg/dL — ABNORMAL LOW (ref 8.9–10.3)
Creatinine, Ser: 1.05 mg/dL — ABNORMAL HIGH (ref 0.44–1.00)
GFR calc Af Amer: >60 mL/min (ref >60)
GFR calc non Af Amer: >60 mL/min (ref >60)
Glucose, Bld: 133 mg/dL — ABNORMAL HIGH (ref 65–99)
POTASSIUM: 3.9 mmol/L (ref 3.5–5.1)
SODIUM: 135 mmol/L (ref 135–145)

## 2016-08-20 LAB — HEMOGLOBIN AND HEMATOCRIT, BLOOD
HEMATOCRIT: 34.3 % — AB (ref 36.0–46.0)
HEMOGLOBIN: 11.4 g/dL — AB (ref 12.0–15.0)

## 2016-08-20 MED ORDER — FENTANYL CITRATE (PF) 100 MCG/2ML IJ SOLN
25.0000 ug | INTRAMUSCULAR | Status: DC | PRN
  Administered 2016-08-20 – 2016-08-21 (×8): 25 ug via INTRAVENOUS
  Filled 2016-08-20 (×8): qty 2

## 2016-08-20 NOTE — Op Note (Signed)
Allison Guerra, Allison Guerra             ACCOUNT NO.:  [id number]  MEDICAL RECORD NO.:  [id number]  LOCATION:                                FACILITY:  [facility]  PHYSICIAN:  Alexis Frock, MD     DATE OF BIRTH:  05/16/1966  DATE OF PROCEDURE:  08/19/2016                              OPERATIVE REPORT   PREOPERATIVE DIAGNOSIS:  Small left solid enhancing renal mass.  POSTOPERATIVE DIAGNOSIS:  Small left solid enhancing renal mass.  PROCEDURES: 1. Robotic-assisted laparoscopic left partial nephrectomy. 2. Cystoscopy. 3. Laparoscopic limited adhesiolysis. 4. Intraoperative ultrasound with interpretation.  ESTIMATED BLOOD LOSS:  50 mL.  COMPLICATION:  None.  SPECIMEN:  Small left renal mass for permanent pathology.  FINDINGS: 1. Unremarkable urinary bladder. 2. Significant adhesions between the descending colon and abdominal     wall and the abdomen and pelvis. 3. Single artery, single vein, left renovascular anatomy as     anticipated. 4. Predominantly endophytic solid left anterior inferior renal mass.  ASSISTANT:  Bari Mantis, PA.  INDICATION:  Allison Guerra is a pleasant 50 year old lady who was found on workup of microscopic hematuria to have a small enhancing left renal mass, less than 2 cm.  She also had ipsilateral non-complex cyst, both of these were quite endophytic.  Options were discussed for management including surveillance protocols for which she is clearly a candidate for versus ablative therapies versus surgical extirpation with and without nephron sparing.  After consideration of these options, she adamantly wished to proceed with left partial nephrectomy.  We discussed on multiple occasions that she was also a very good surveillance candidate as well, but she wished to proceed with surgical extirpation. Also performed cystoscopy concomitantly as she does have microscopic hematuria and this has not yet been performed.  Informed consent was obtained and placed in  the medical record.  PROCEDURE IN DETAIL:  The patient being Stuffle verified.  Procedure being left robotic partial nephrectomy and cystoscopy was confirmed. Procedure was carried out.  Time-out was performed.  Intravenous antibiotics were administered.  General endotracheal anesthesia introduced.  The patient initially was placed into a supine frog-leg position.  Sterile field was created, prepping her vagina, introitus, and proximal thighs with iodine and cystourethroscopy was performed using a 16-French flexible cystoscope.  Flexible cystoscopy revealed unremarkable urinary bladder without stones, diverticula, or papillary lesions.  Ureteral orifices appeared singleton bilaterally.  Retroflexion was performed, which revealed no additional findings.  Cystoscope was then exchanged for a new 14-French Foley catheter straight drained 10 mL sterile water in the balloon.  The patient was repositioned into a left side up full flank position, applying 15 degrees of stable flexion, superior arm elevator, axillary roll, sequential pressure devices, bottom leg bent, top leg straight. She was further fashioned to the operating table using 3-inch tape over foam padding across her supraxiphoid chest and her pelvis.  Superior arm elevator was employed.  All bony prominences were padded.  The patient does have history of myomectomy and abdominoplasty.  Initial point for Veress insufflation was chosen in the left lower quadrant away from prior scars, having passed the aspiration and drop tests.  A high-flow, low-pressure pneumoperitoneum was easily obtained.  Next  an 8-mm robotic camera port was placed in position approximately 4 fingerbreadths superolateral to the umbilicus.  Laparoscopic examination of the peritoneal cavity revealed some significant adhesions of the descending colon, some in the left upper quadrant near the spleen and others in the pelvis, but no visceral injury.  Additional  ports were then placed as follows; left subcostal 8-mm robotic port, left far lateral 8-mm robotic port 3 fingerbreadths superior medial to the anterior superior iliac spine, left paramedian inferior robotic port approximately a handbreadth superior to the pubic ramus and two 12-mm assistant ports in the midline, one in the supraumbilical crease and another approximately 2 fingerbreadths above the camera port.  Robot was docked and passed through the electronic checks.  Initial attention was directed at adhesiolysis, very careful adhesiolysis was performed of some epiploic fat and some omental adhesions of the area of the splenic flexure into the descending colon and lateral abdominal wall towards the area of the psoas musculature.  The colon was carefully reflected additionally medially, allowing visualization of the anterior surface of Gerota's fascia, laterally splenic attachments were performed allowing the spleen to rotate medially and self-retracting.  The lower pole of kidney was identified and placed on gentle lateral traction.  Dissection proceeded medial to this.  The ureter and gonadal vein were encountered and placed on gentle lateral traction.  Dissection proceeded in the triangle of the ureter and gonadal psoas musculature towards the renal hilum.  Renal hilum consisted of a single artery and single vein, renovascular anatomy as anticipated.  The artery was circumferentially mobilized, marked with vessel loop.  Attention was then turned at identification of the mass. 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  A fat pad was left on the anterior surface of this and the area was interrogated  with intraoperative ultrasound.  Intraoperative ultrasound using intraoperative laparoscopic probe, operated solid heterogeneous mass in location as anticipated.  The completely simple appearing cyst was also visualized and used as a marker to help confirm mass in question.  The deep aspect was visualized to abut nearly to the renal sinus.  The edges were carefully scored. Warm ischemia was achieved using 2 bulldog clamps on the artery alone and partial nephrectomy was performed using cold scissors down to the area of sinus fat, keeping what appeared to be a small rim of normal parenchyma around the mass, which was then placed into an EndoCatch bag. First layer renorrhaphy was performed using running 3-0 Vicryl and a very small approximately pendulous size bolster was applied within the partial nephrectomy specimen into parenchymal apposition with 2 sutures of 0 Vicryl.  Skin between Hem-O-Loks and lapper ties were used resulted in excellent parenchymal apposition.  The bulldog clamps were taken off for total warm ischemia time of 14 minutes.  Hemostasis appeared excellent at this point.  Gerota's was reapproximated using a running 3- 0 V-Loc, after applying 10 mL of FloSeal to the area of partial nephrectomy.  The vessel loop around the artery was removed.  All sponge and needle counts were correct.  Robot was then undocked.  After close suction, drain was brought through the previous lateral most robotic port site near to the area of the peritoneal cavity.  Specimen was retrieved via the superior most  port site and this did not require any further dilation of this.  Next still under pneumoperitoneum, both 12 mm system port sites were closed at the level of the fascia using Minette Headland suture passer under laparoscopic vision.  All incision sites were infiltrated with dilute lyophilized Marcaine and closed at the level of the skin using subcuticular Monocryl followed by Dermabond.   Procedure terminated.  The patient tolerated the procedure well.  No immediate periprocedural complications.  The patient was taken to the postanesthesia care unit in stable condition.          ______________________________ Alexis Frock, MD     TM/MEDQ  D:  08/19/2016  T:  08/20/2016  Job:  [id number]

## 2016-08-20 NOTE — Progress Notes (Signed)
Pt states that she will call when she is ready for bed/CPAP.

## 2016-08-20 NOTE — Progress Notes (Signed)
PRN neb given. No respiratory distress noted at this time.

## 2016-08-20 NOTE — Progress Notes (Signed)
Spoke with pt concerning Home Health. Pt and husband at bedside stated that they spoke with the doctor about [facility] and there is no needs at this present time.

## 2016-08-20 NOTE — Progress Notes (Signed)
1 Day Post-Op  Subjective:  1 - Small Left Renal Mass - s/p robotic left partial nephrectomy with limited adhesiolysis and cystoscopy on 08/19/16 the day of admission. POD 1 Hgb 11.4, Cr 1.05, and JP removed as output scant.   Today "Allison Guerra" is stable. Some pain with abmulation and headache with pain meds. No fevers / emesis.   Objective: Vital signs in last 24 hours: Temp:  [97.5 F (36.4 C)-98.4 F (36.9 C)] 98.3 F (36.8 C) (12/28 0938) Pulse Rate:  [61-85] 69 (12/28 0938) Resp:  [11-23] 18 (12/28 0938) BP: (107-171)/(48-104) 127/72 (12/28 0938) SpO2:  [95 %-100 %] 99 % (12/28 1012) Last BM Date: 08/18/16  Intake/Output from previous day: 12/27 0701 - 12/28 0700 In: 3008.4 [P.O.:390; I.V.:2618.4] Out: 675 [Urine:510; Drains:115; Blood:50] Intake/Output this shift: No intake/output data recorded.  General appearance: alert, cooperative and appears stated age Eyes: negative Nose: Nares normal. Septum midline. Mucosa normal. No drainage or sinus tenderness. Throat: lips, mucosa, and tongue normal; teeth and gums normal Neck: supple, symmetrical, trachea midline Back: symmetric, no curvature. ROM normal. No CVA tenderness. Resp: non-labored on minimal [place] O2 Cardio: Nl rate GI: soft, non-tender; bowel sounds normal; no masses,  no organomegaly Pelvic: external genitalia normal and foley now out.  Extremities: extremities normal, atraumatic, no cyanosis or edema Pulses: 2+ and symmetric Skin: Skin color, texture, turgor normal. No rashes or lesions Lymph nodes: Cervical, supraclavicular, and axillary nodes normal. Neurologic: Grossly normal Incision/Wound: recent port sites c/d/i. Dry dressing in place over prior JP site.   Lab Results:   Recent Labs  08/19/16 1514 08/20/16 0459  HGB 11.4* 11.4*  HCT 35.2* 34.3*   BMET  Recent Labs  08/20/16 0459  NA 135  K 3.9  CL 101  CO2 26  GLUCOSE 133*  BUN 13  CREATININE 1.05*  CALCIUM 8.7*   PT/INR No results for  input(s): LABPROT, INR in the last 72 hours. ABG No results for input(s): PHART, HCO3 in the last 72 hours.  Invalid input(s): PCO2, PO2  Studies/Results: No results found.  Anti-infectives: Anti-infectives    Start     Dose/Rate Route Frequency Ordered Stop   08/19/16 1058  ceFAZolin (ANCEF) IVPB 2g/100 mL premix     2 g 200 mL/hr over 30 Minutes Intravenous 30 min pre-op 08/19/16 1058 08/19/16 1234      Assessment/Plan:  1 - Small Left Renal Mass - doing well POD 1. Foley and JP DC'd. Discussed goals for discharge, likely will be able tomorrow based on current progress.   [facility], Kehinde Totzke 08/20/2016

## 2016-08-21 MED ORDER — OXYCODONE-ACETAMINOPHEN 5-325 MG PO TABS: 1.0000 | ORAL_TABLET | Freq: Four times a day (QID) | ORAL | 0 refills | Status: DC | PRN

## 2016-08-21 MED ORDER — SENNOSIDES-DOCUSATE SODIUM 8.6-50 MG PO TABS: 1.0000 | ORAL_TABLET | Freq: Two times a day (BID) | ORAL | 0 refills | Status: DC

## 2016-08-21 NOTE — Progress Notes (Signed)
Patient placed on hospital CPAP machine with no complications. Nurse aware. 

## 2016-08-21 NOTE — Discharge Summary (Signed)
Physician Discharge Summary  Patient ID: Allison Guerra MRN: [id number] DOB/AGE: 1966/08/23 50 y.o.  Admit date: 08/19/2016 Discharge date: 08/21/2016  Admission Diagnoses: Left Renal mass  Discharge Diagnoses:  Active Problems:   Renal mass   Discharged Condition: good  Hospital Course:   1 - Small Left Renal Mass - s/p robotic left partial nephrectomy with limited adhesiolysis and cystoscopy on 08/19/16 the day of admission. POD 1 Hgb 11.4, Cr 1.05, and JP removed as output scant. By the morning of POD 2, the day of discharge, she is ambulatory, pain controlled on PO meds, maintaining PO hydration, and felt to be adequate for discharge. Final pathology pending at discharge.   Consults: None  Significant Diagnostic Studies: labs: as per above  Treatments: surgery:  left partial nephrectomy with limited adhesiolysis and cystoscopy on 08/19/16   Discharge Exam: Blood pressure 125/64, pulse 84, temperature 99.5 F (37.5 C), temperature source Oral, resp. rate 18, height 5\' 4"  (1.626 m), weight 106.1 kg (234 lb), SpO2 92 %. General appearance: alert, cooperative and appears stated age Eyes: negative Nose: Nares normal. Septum midline. Mucosa normal. No drainage or sinus tenderness. Throat: lips, mucosa, and tongue normal; teeth and gums normal Neck: supple, symmetrical, trachea midline Back: symmetric, no curvature. ROM normal. No CVA tenderness. Resp: non-labored on room air Cardio: Nl rate GI: soft, non-tender; bowel sounds normal; no masses,  no organomegaly Pelvic: external genitalia normal and foley now out Extremities: extremities normal, atraumatic, no cyanosis or edema Skin: Skin color, texture, turgor normal. No rashes or lesions Lymph nodes: Cervical, supraclavicular, and axillary nodes normal. Neurologic: Grossly normal Incision/Wound: recent surgical sites c/d/i. NO hernias / drainage.   Disposition: 01-Home or Self Care   Allergies as of 08/21/2016    Reactions   Hydromet [hydrocodone-homatropine] Nausea And Vomiting   Reglan [metoclopramide] Other (See Comments)   Made her aggressive, made her feel crazy   Prednisone Anxiety   Jittery      Medication List    STOP taking these medications   acetaminophen 500 MG tablet Commonly known as:  TYLENOL   phentermine 37.5 MG capsule   promethazine-codeine 6.25-10 MG/5ML syrup Commonly known as:  PHENERGAN with CODEINE     TAKE these medications   albuterol (2.5 MG/3ML) 0.083% nebulizer solution Commonly known as:  PROVENTIL Take 2.5 mg by nebulization every 6 (six) hours as needed for wheezing or shortness of breath.   PROAIR HFA 108 (90 Base) MCG/ACT inhaler Generic drug:  albuterol Inhale 2 puffs into the lungs every 6 (six) hours as needed for wheezing or shortness of breath.   FLUTTER Devi Use as directed   mometasone-formoterol 100-5 MCG/ACT Aero Commonly known as:  DULERA Take 2 puffs first thing in am and then another 2 puffs about 12 hours later.   oseltamivir 75 MG capsule Commonly known as:  TAMIFLU Take 75 mg by mouth daily.   oxyCODONE-acetaminophen 5-325 MG tablet Commonly known as:  ROXICET Take 1-2 tablets by mouth every 6 (six) hours as needed for moderate pain or severe pain. Post-operatively   pantoprazole 40 MG tablet Commonly known as:  PROTONIX Take 1 tablet (40 mg total) by mouth 2 (two) times daily. What changed:  when to take this   senna-docusate 8.6-50 MG tablet Commonly known as:  Senokot-S Take 1 tablet by mouth 2 (two) times daily. While taking pain meds to prevent constipation.   valsartan-hydrochlorothiazide 160-25 MG tablet Commonly known as:  DIOVAN-HCT Take 1 tablet by mouth daily.  Follow-up Information    Alexis Frock, MD On 09/03/2016.   Specialty:  Urology Why:  at 9:15 for MD visit.  Contact information: [place] [address] [phone]           Signed: Alexis Frock 08/21/2016, 6:39  AM

## 2016-09-22 ENCOUNTER — Ambulatory Visit: Payer: Managed Care, Other (non HMO)

## 2016-09-30 ENCOUNTER — Ambulatory Visit
Admission: RE | Admit: 2016-09-30 | Discharge: 2016-09-30 | Disposition: A | Discharge status: Home / Self Care | Payer: Managed Care, Other (non HMO) | Source: Ambulatory Visit | Attending: Internal Medicine | Admitting: Internal Medicine

## 2016-09-30 DIAGNOSIS — Z1231 Encounter for screening mammogram for malignant neoplasm of breast: Secondary | ICD-10-CM

## 2017-01-02 IMAGING — CR DG CHEST 1V PORT
1 series · 1 of 1 positions shown · non-contrast
Comparison: None.

CLINICAL DATA: Asthma, hypertension, sleep apnea.

EXAM:
PORTABLE CHEST - 1 VIEW

[AP]
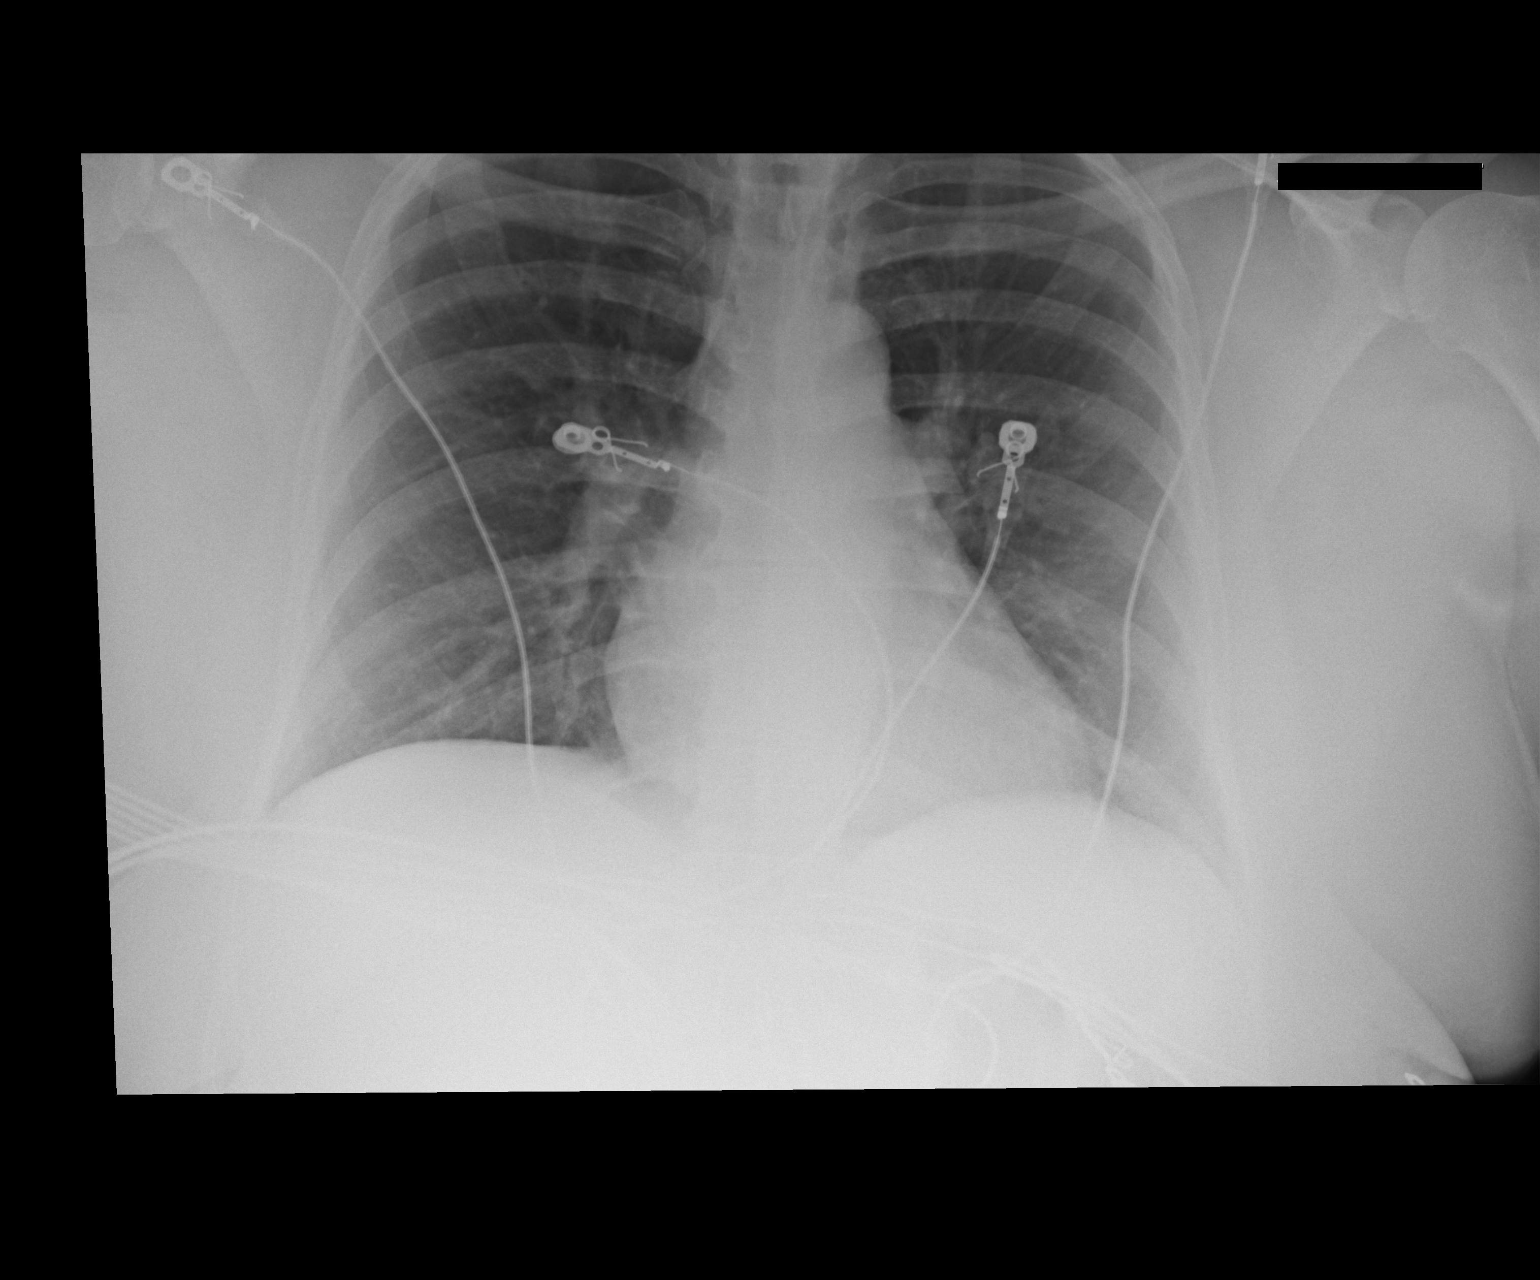

[1 of 1 positions shown; findings below may reference images not displayed]

FINDINGS: The heart size and mediastinal contours are within normal limits.
Both lungs are clear. The visualized skeletal structures are
unremarkable.
IMPRESSION: No active disease.

## 2017-01-14 IMAGING — CR DG ABDOMEN 1V
2 series · 2 of 2 positions shown · non-contrast
Comparison: None.

CLINICAL DATA: Generalized abdominal pain and nausea today.

EXAM:
ABDOMEN - 1 VIEW

[t abdomen supine (1 of 2)]
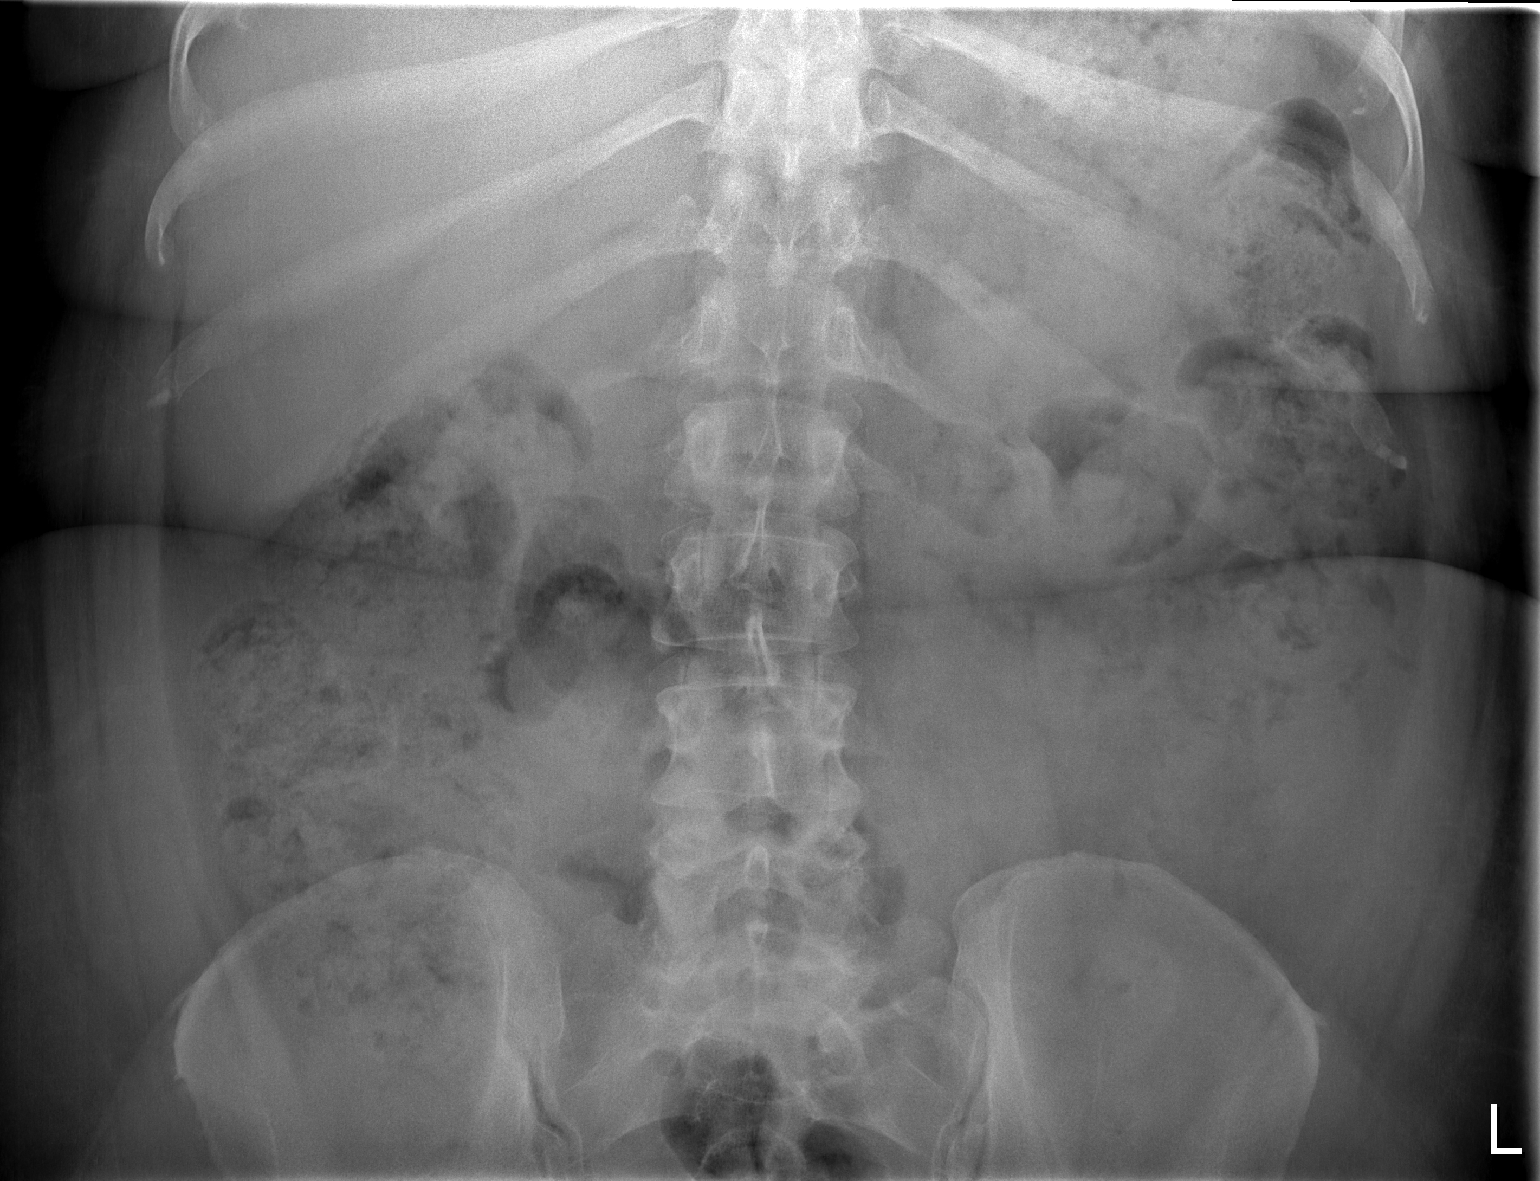

[t abdomen supine (2 of 2)]
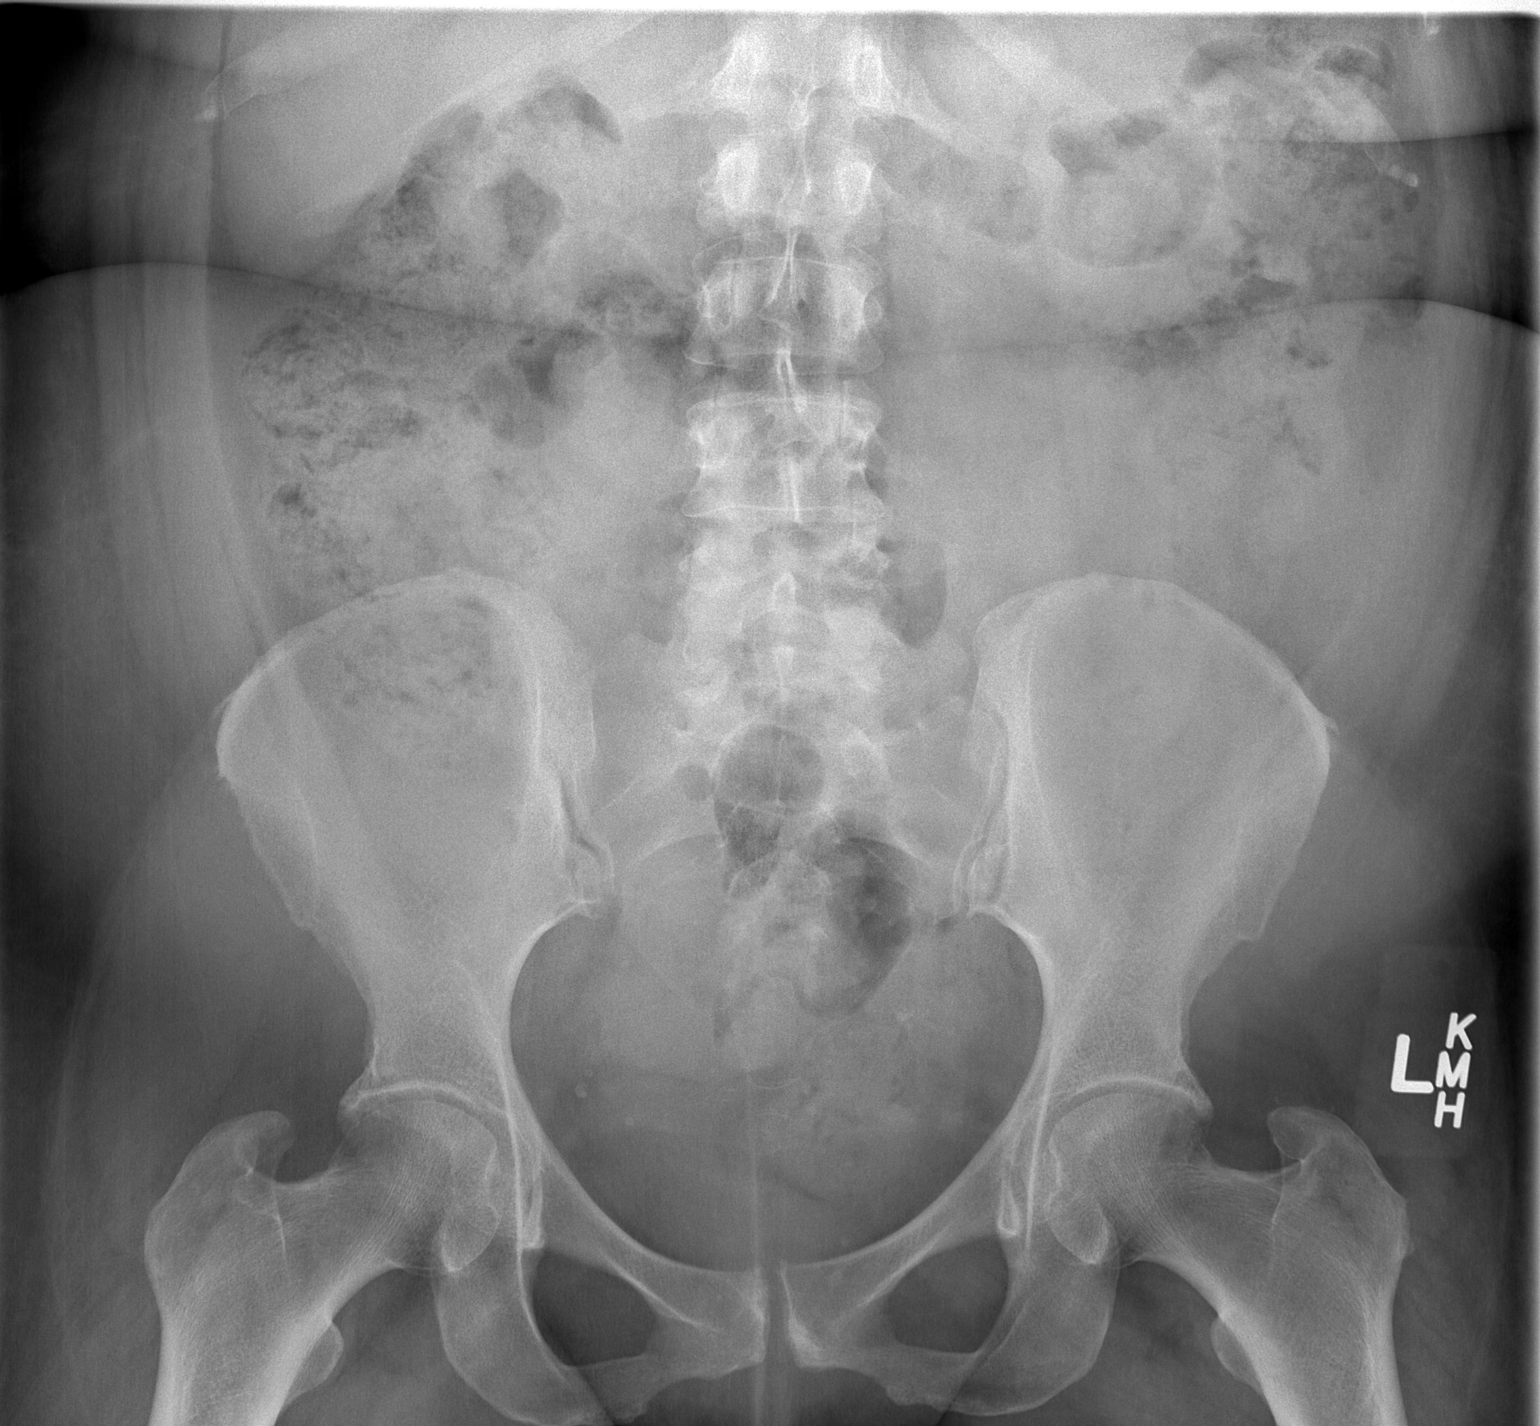

[2 of 2 positions shown; findings below may reference images not displayed]

FINDINGS: The bowel gas pattern is normal. Moderate stool burden identified.
No radio-opaque calculi or other significant radiographic
abnormality are seen. Calcifications within the pelvis are likely
vascular.
IMPRESSION: Moderate stool burden.

## 2017-01-25 IMAGING — CR DG CHEST 2V
2 series · 2 of 2 positions shown · non-contrast
Comparison: 04/30/2015

CLINICAL DATA: Cough and congestion

EXAM:
CHEST - 2 VIEW

[view not recorded (1 of 2)]
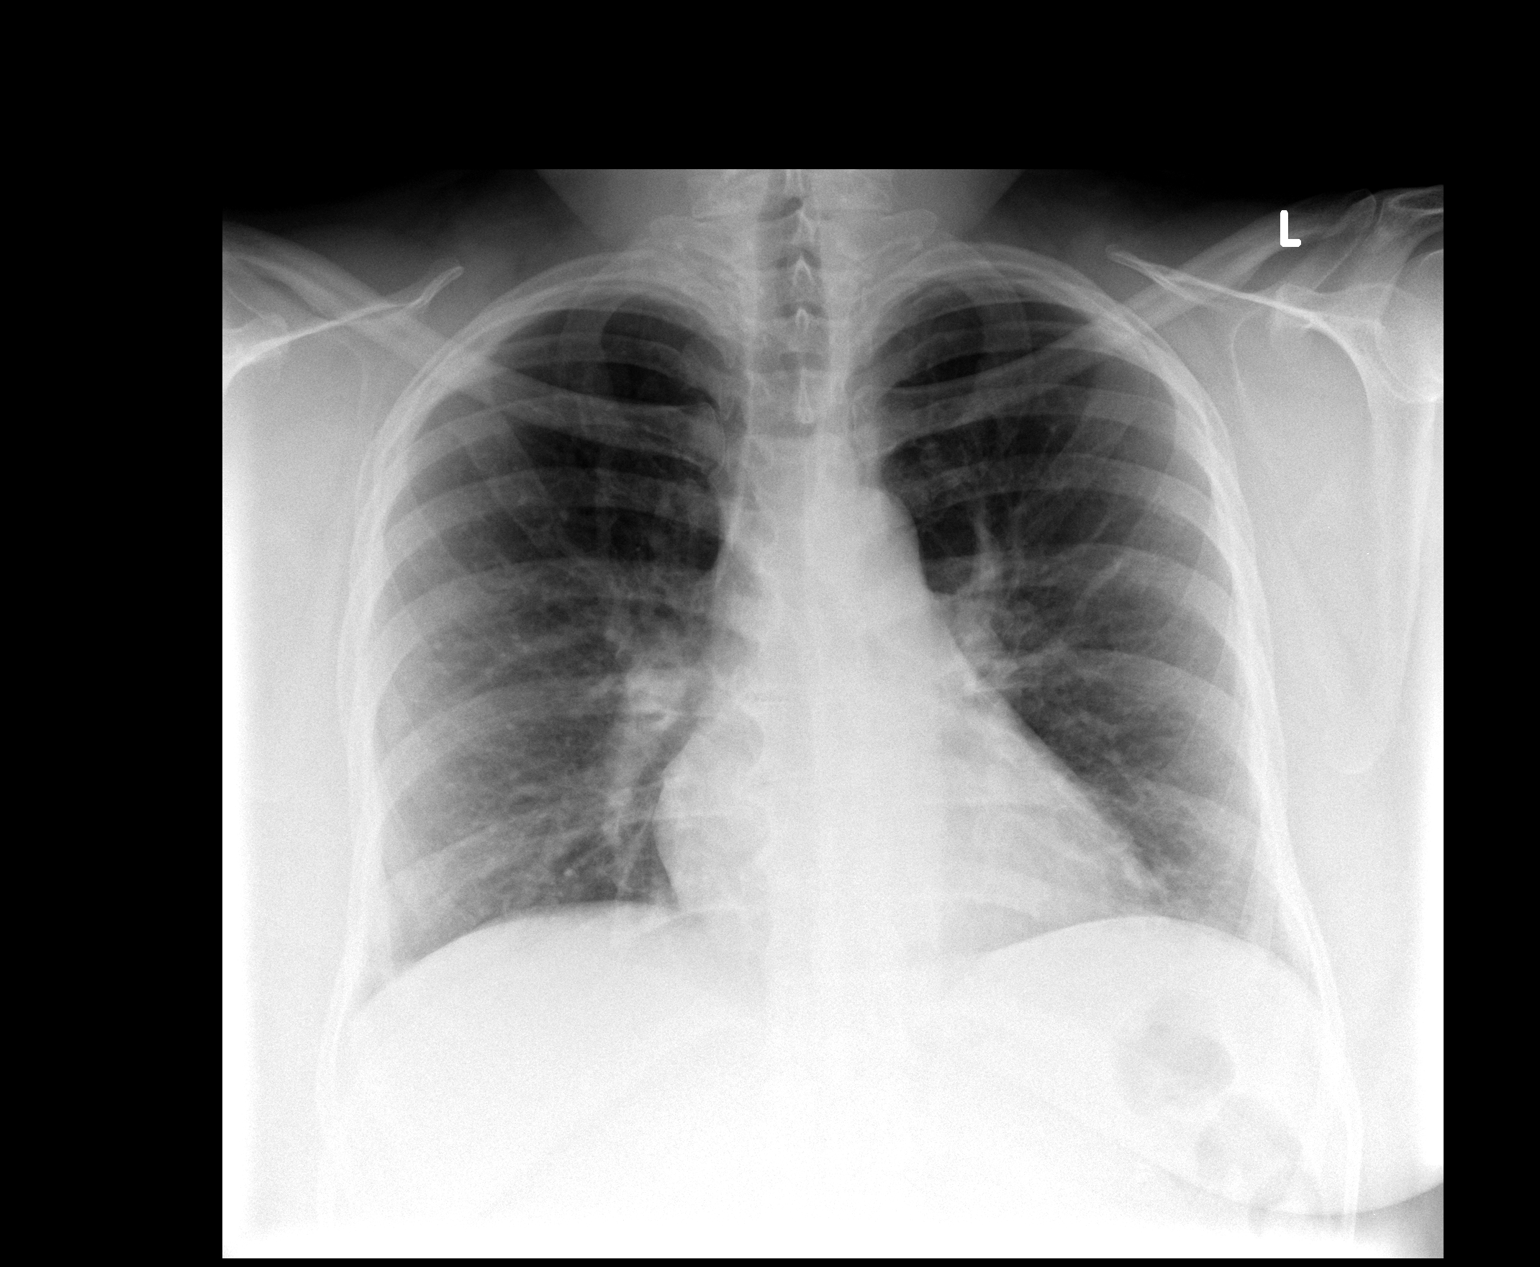

[view not recorded (2 of 2)]
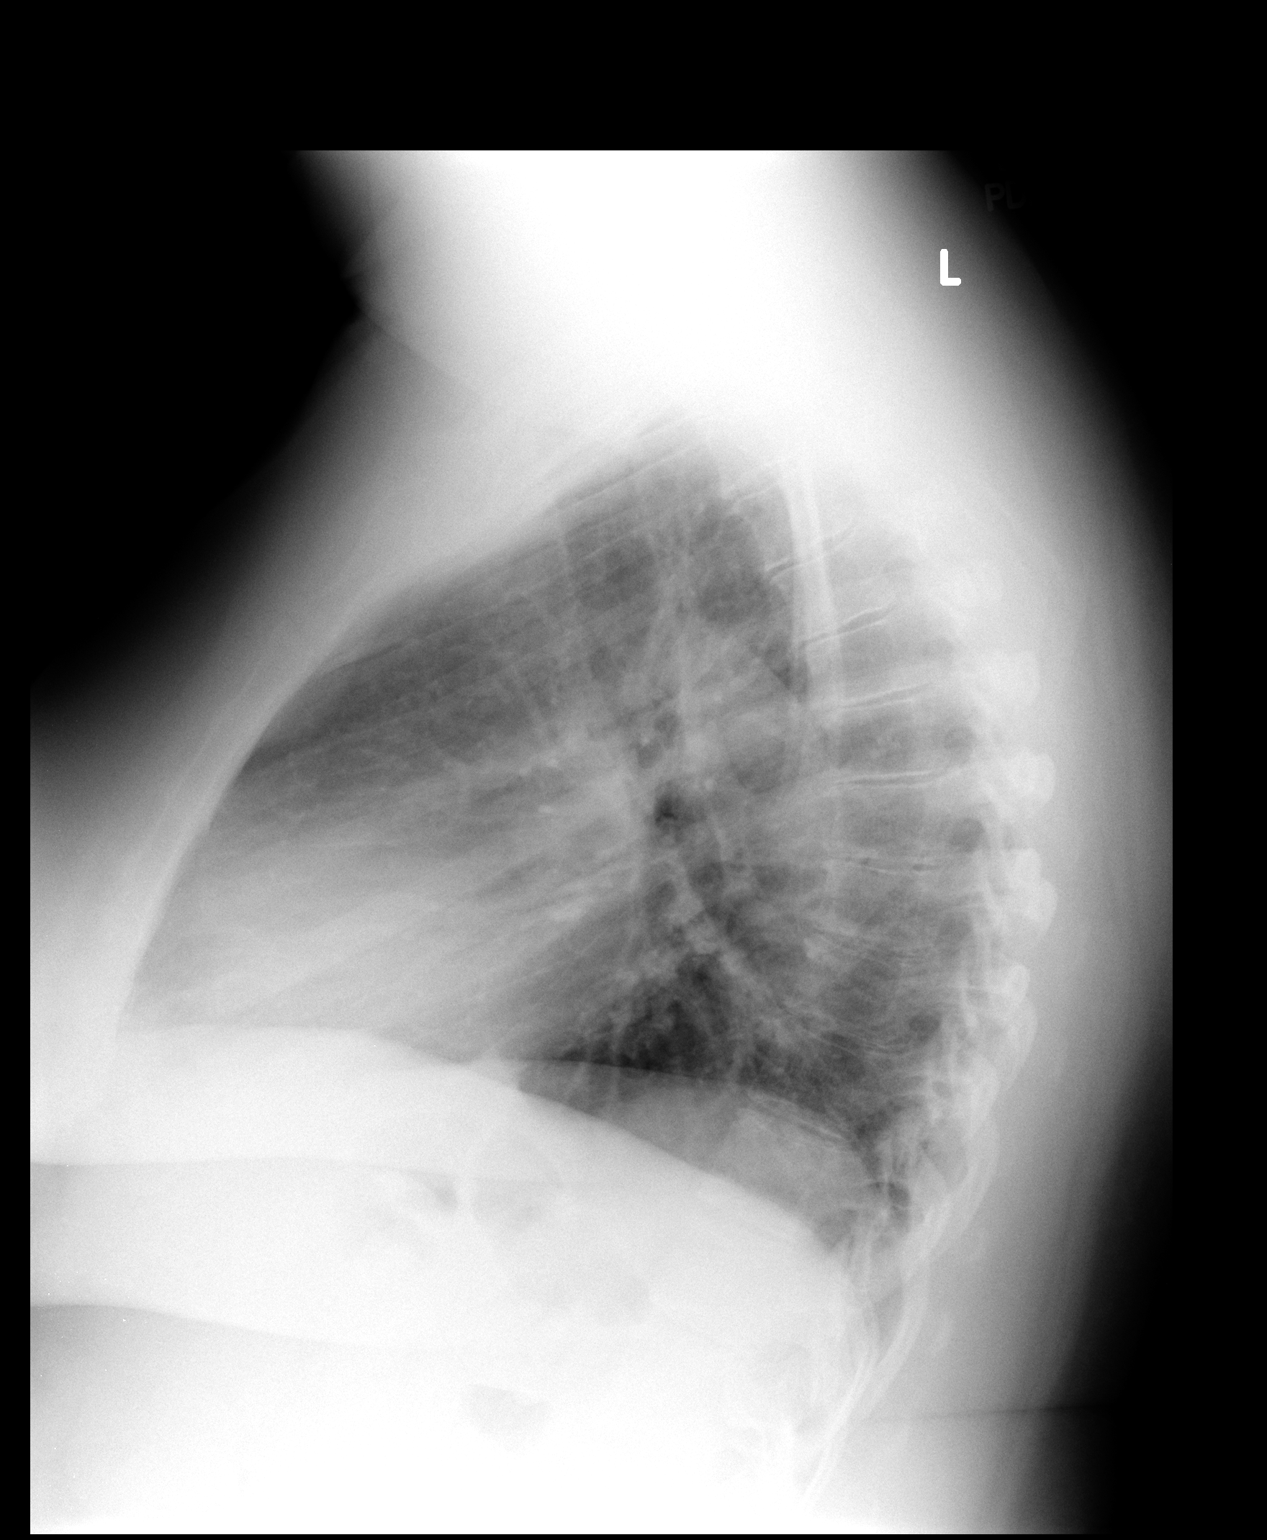

[2 of 2 positions shown; findings below may reference images not displayed]

FINDINGS: The heart size and mediastinal contours are within normal limits.
Both lungs are clear. The visualized skeletal structures are
unremarkable.
IMPRESSION: No active disease.

## 2017-03-01 ENCOUNTER — Other Ambulatory Visit: Payer: Self-pay | Admitting: Urology

## 2017-03-01 ENCOUNTER — Ambulatory Visit (HOSPITAL_COMMUNITY)
Admission: RE | Admit: 2017-03-01 | Discharge: 2017-03-01 | Disposition: A | Discharge status: Home / Self Care | Payer: Managed Care, Other (non HMO) | Source: Ambulatory Visit | Attending: Urology | Admitting: Urology

## 2017-03-01 DIAGNOSIS — C642 Malignant neoplasm of left kidney, except renal pelvis: Secondary | ICD-10-CM | POA: Insufficient documentation

## 2017-06-09 ENCOUNTER — Emergency Department (HOSPITAL_COMMUNITY)
Admission: EM | Admit: 2017-06-09 | Discharge: 2017-06-09 | Disposition: A | Discharge status: Home / Self Care | Payer: 59

## 2017-06-10 ENCOUNTER — Encounter (HOSPITAL_COMMUNITY): Payer: Self-pay

## 2017-06-10 ENCOUNTER — Emergency Department (HOSPITAL_COMMUNITY)
Admission: EM | Admit: 2017-06-10 | Discharge: 2017-06-10 | Disposition: A | Discharge status: Home / Self Care | Payer: 59 | Attending: Emergency Medicine | Admitting: Emergency Medicine

## 2017-06-10 DIAGNOSIS — R51 Headache: Secondary | ICD-10-CM | POA: Diagnosis present

## 2017-06-10 DIAGNOSIS — I10 Essential (primary) hypertension: Secondary | ICD-10-CM | POA: Insufficient documentation

## 2017-06-10 DIAGNOSIS — J45909 Unspecified asthma, uncomplicated: Secondary | ICD-10-CM | POA: Insufficient documentation

## 2017-06-10 DIAGNOSIS — G518 Other disorders of facial nerve: Secondary | ICD-10-CM | POA: Diagnosis not present

## 2017-06-10 DIAGNOSIS — Z79899 Other long term (current) drug therapy: Secondary | ICD-10-CM | POA: Diagnosis not present

## 2017-06-10 DIAGNOSIS — Z87891 Personal history of nicotine dependence: Secondary | ICD-10-CM | POA: Diagnosis not present

## 2017-06-10 MED ORDER — CARBAMAZEPINE 200 MG PO TABS: ORAL_TABLET | ORAL | 0 refills | Status: AC

## 2017-06-10 MED ORDER — ALBUTEROL SULFATE (2.5 MG/3ML) 0.083% IN NEBU
5.0000 mg | INHALATION_SOLUTION | Freq: Once | RESPIRATORY_TRACT | Status: AC
  Administered 2017-06-10: 5 mg via RESPIRATORY_TRACT
  Filled 2017-06-10: qty 6

## 2017-06-10 NOTE — ED Triage Notes (Signed)
patient c/o sharp pain right side of head x 4 days. Patient states she has been taking Ibuprofen with no relief. Patient denies N/V and states she always has blurred vision.

## 2017-06-10 NOTE — ED Provider Notes (Signed)
[facility] DEPT Provider Note   CSN: [id number] Arrival date & time: 06/10/17  0913     History   Chief Complaint Chief Complaint  Patient presents with  . Headache    HPI Allison Guerra is a 51 y.o. female.  HPI Complains of headache onset 4 days ago which is intermittent lasting a few  Is at a time coming several times per day. She is presently asymptomatic pain originated in right ear and has since moved to right temporal area she denies any ear pain presently. She was treated by her PMD with eardrops, without relief. No fever no trauma nothing makes symptoms better or worse. Other symptoms include mild wheezing and she feels that she's had a mild flareup of her asthma since being in the hospital this morning. Denies visual changes stating she's had blurred vision in her right eye for 8 months. She has been prescribed glasses by her eye doctor which she has not gotten yet. No other associated symptoms Past Medical History:  Diagnosis Date  . Anxiety   . Asthma   . Depression   . GERD (gastroesophageal reflux disease)   . H/O multiple allergies    states "seasonal allergies last all year"  . Headache   . Hypertension   . Pneumonia   . Sleep apnea   . Vocal cord dysfunction     Patient Active Problem List   Diagnosis Date Noted  . 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 Chest pain 01/06/2016  . Asthma exacerbation 01/06/2016  . Musculoskeletal pain 01/06/2016  . Pleuritic chest pain 01/06/2016  . GERD (gastroesophageal reflux disease) 01/06/2016  . Pain in the chest   . Upper airway cough syndrome ? with element of cough variant asthma  05/18/2015  . Severe obesity (BMI >= 40) ([place]) 05/18/2015  . Bacteremia 05/02/2015  . Prediabetes 05/02/2015  . CAP (community acquired pneumonia) 04/30/2015  . SOB (shortness of breath) 04/30/2015  . Constipation 04/30/2015  . Elevated blood sugar 04/30/2015  . Hypertension   . Sleep apnea   . Vocal cord  dysfunction   . Anxiety     Past Surgical History:  Procedure Laterality Date  . PARTIAL HYSTERECTOMY     08/14/16-  states unsure if uterus was removed  . ROBOTIC ASSITED PARTIAL NEPHRECTOMY Left 08/19/2016   Procedure: XI ROBOTIC ASSITED PARTIAL NEPHRECTOMY WITH ULTRASOUND; LYSIS OF ADHESIONS; WITH FLEXIBLE CYSTOSCOPY;  Surgeon: Alexis Frock, MD;  Location: [facility];  Service: Urology;  Laterality: Left;  . uterine ablation    . UTERINE FIBROID SURGERY      OB History    No data available       Home Medications    Prior to Admission medications   Medication Sig Start Date End Date Taking? Authorizing Provider  albuterol (PROAIR HFA) 108 (90 BASE) MCG/ACT inhaler Inhale 2 puffs into the lungs every 6 (six) hours as needed for wheezing or shortness of breath.   Yes [provider]  fluticasone furoate-vilanterol (BREO ELLIPTA) 100-25 MCG/INH AEPB Inhale 1-2 puffs into the lungs daily.   Yes [provider]  pantoprazole (PROTONIX) 40 MG tablet Take 1 tablet (40 mg total) by mouth 2 (two) times daily. Patient taking differently: Take 40 mg by mouth daily.  05/08/15  Yes Kelvin Cellar, MD  Respiratory Therapy Supplies (FLUTTER) DEVI Use as directed 05/31/15  Yes Tanda Rockers, MD  albuterol (PROVENTIL) (2.5 MG/3ML) 0.083% nebulizer solution Take 2.5 mg by nebulization every 6 (six) hours as  needed for wheezing or shortness of breath.    [provider]  mometasone-formoterol (DULERA) 100-5 MCG/ACT AERO Take 2 puffs first thing in am and then another 2 puffs about 12 hours later. Patient not taking: Reported on 06/10/2017 05/17/15   Tanda Rockers, MD  oseltamivir (TAMIFLU) 75 MG capsule Take 75 mg by mouth daily.    [provider]  oxyCODONE-acetaminophen (ROXICET) 5-325 MG tablet Take 1-2 tablets by mouth every 6 (six) hours as needed for moderate pain or severe pain. Post-operatively Patient not taking: Reported on 06/10/2017 08/21/16   Alexis Frock, MD  senna-docusate (SENOKOT-S) 8.6-50 MG tablet Take 1 tablet by mouth 2 (two) times daily. While taking pain meds to prevent constipation. 08/21/16   Alexis Frock, MD  valsartan-hydrochlorothiazide (DIOVAN-HCT) 160-25 MG tablet Take 1 tablet by mouth daily. 12/02/15   [provider]    Family History Family History  Problem Relation Age of Onset  . Cancer - Lung Mother   . Hypertension Father   . Hypertension Sister     Social History Social History  Substance Use Topics  . Smoking status: Former Smoker    Packs/day: 0.50    Types: Cigarettes    Quit date: 08/24/1994  . Smokeless tobacco: Never Used  . Alcohol use Yes     Comment: occasionally     Allergies   Hydromet [hydrocodone-homatropine]; Reglan [metoclopramide]; and Prednisone   Review of Systems Review of Systems  Constitutional: Negative.   HENT: Negative.   Eyes: Positive for visual disturbance.       Chronically blurred vision  Respiratory: Positive for shortness of breath.        Chronic noisy breathing secondary to vocal cord paralysis  Cardiovascular: Negative.   Gastrointestinal: Negative.   Musculoskeletal: Negative.   Skin: Negative.   Neurological: Positive for headaches.  Psychiatric/Behavioral: Negative.   All other systems reviewed and are negative.    Physical Exam Updated Vital Signs BP 139/81 (BP Location: Right Arm)   Pulse 72   Temp 98.3 F (36.8 C) (Oral)   Resp 18   Ht 5\' 4"  (1.626 m)   Wt 101.6 kg (224 lb)   SpO2 98%   BMI 38.45 kg/m   Physical Exam  Constitutional: She is oriented to person, place, and time. She appears well-developed and well-nourished. No distress.  HENT:  Head: Normocephalic and atraumatic.  Eyes: Pupils are equal, round, and reactive to light. Conjunctivae are normal.  Neck: Neck supple. No tracheal deviation present. No thyromegaly present.  Cardiovascular: Normal rate and regular rhythm.   No murmur heard. Pulmonary/Chest:  Effort normal and breath sounds normal.  Abdominal: Soft. Bowel sounds are normal. She exhibits no distension. There is no tenderness.  Musculoskeletal: Normal range of motion. She exhibits no edema or tenderness.  Neurological: She is alert and oriented to person, place, and time. Coordination normal.  Gait normal Romberg normal pronator drift normal motor strength 5 over 5 overall. Cranial nerves II through XII grossly intact  Skin: Skin is warm and dry. No rash noted.  Psychiatric: She has a normal mood and affect.  Nursing note and vitals reviewed.    ED Treatments / Results  Labs (all labs ordered are listed, but only abnormal results are displayed) Labs Reviewed - No data to display  EKG  EKG Interpretation None       Radiology No results found.  Procedures Procedures (including critical care time)  Medications Ordered in ED Medications  albuterol (PROVENTIL) (2.5  MG/3ML) 0.083% nebulizer solution 5 mg (not administered)    Initial Impression / Assessment and Plan / ED Course  I have reviewed the triage vital signs and the nursing notes.  Pertinent labs & imaging results that were available during my care of the patient were reviewed by me and considered in my medical decision making (see chart for details).   patient reports breathing is normal after treatment with one albuterol nebulized treatment.  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 Return if needed more than every 4 hours.  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

## 2017-06-10 NOTE — Discharge Instructions (Signed)
Use your albuterol inhaler or nebulizer every 4 hours as needed for shortness of breath. Return if needed more than every 4 hours or see your doctor. Take the medication prescribed to help with headache. Call [facility] [facility] today to schedule an office visit.

## 2017-06-16 ENCOUNTER — Ambulatory Visit (INDEPENDENT_AMBULATORY_CARE_PROVIDER_SITE_OTHER): Payer: 59 | Admitting: Neurology

## 2017-06-16 ENCOUNTER — Encounter: Payer: Self-pay | Admitting: Neurology

## 2017-06-16 VITALS — BP 146/87 | HR 97 | Ht 64.0 in | Wt 243.0 lb

## 2017-06-16 DIAGNOSIS — Z5181 Encounter for therapeutic drug level monitoring: Secondary | ICD-10-CM

## 2017-06-16 DIAGNOSIS — F5104 Psychophysiologic insomnia: Secondary | ICD-10-CM

## 2017-06-16 DIAGNOSIS — G4489 Other headache syndrome: Secondary | ICD-10-CM | POA: Diagnosis not present

## 2017-06-16 DIAGNOSIS — G43019 Migraine without aura, intractable, without status migrainosus: Secondary | ICD-10-CM

## 2017-06-16 HISTORY — DX: Psychophysiologic insomnia: F51.04

## 2017-06-16 HISTORY — DX: Migraine without aura, intractable, without status migrainosus: G43.019

## 2017-06-16 MED ORDER — TOPIRAMATE 50 MG PO TABS: ORAL_TABLET | ORAL | 3 refills | Status: AC

## 2017-06-16 NOTE — Progress Notes (Signed)
Reason for visit: Headache  Referring physician: [place]  Allison Guerra is a 51 y.o. female  History of present illness:  Allison Guerra is a 51 year old right-handed black female with a history of migraine headaches throughout her life. The patient has generally had 2 or 3 headaches a week, the headaches may last up to 2 hours, seemed to be improved with resting or trying to sleep. The headaches may be associated with photophobia and some phonophobia. The patient usually has frontotemporal headaches. The patient went to the emergency room on 06/10/2017 with a different type of headache. The patient began having pain in the right ear, she has had sharp stabbing pains in the right temporal area that last only a few moments and then go away. The patient initially used eardrops but this did not help, the patient was placed on carbamazepine which seems to have improved some of the stabbing pains. The patient does have some tenderness in the right temporal area. She denies any blurred vision or loss of vision or any numbness or weakness of the face, arms, or legs. She has not had any change in speech or swallowing. She denies neck stiffness, she does report some chronic insomnia problems and some trouble with concentration and memory. This has been significant over the last year. The patient has been on multiple medications for sleeping without benefit. She takes ibuprofen for her headaches. She denies a family history of headache. She is sent to this office for an evaluation.  Past Medical History:  Diagnosis Date  . Anxiety   . Asthma   . Depression   . GERD (gastroesophageal reflux disease)   . H/O multiple allergies    states "seasonal allergies last all year"  . Headache   . Hypertension   . Pneumonia   . Sleep apnea   . Vocal cord dysfunction     Past Surgical History:  Procedure Laterality Date  . PARTIAL HYSTERECTOMY     08/14/16-  states unsure if uterus was removed  . ROBOTIC  ASSITED PARTIAL NEPHRECTOMY Left 08/19/2016   Procedure: XI ROBOTIC ASSITED PARTIAL NEPHRECTOMY WITH ULTRASOUND; LYSIS OF ADHESIONS; WITH FLEXIBLE CYSTOSCOPY;  Surgeon: Alexis Frock, MD;  Location: [facility];  Service: Urology;  Laterality: Left;  . uterine ablation    . UTERINE FIBROID SURGERY      Family History  Problem Relation Age of Onset  . Cancer - Lung Mother   . Hypertension Father   . Pneumonia Father   . Hypertension Sister     Social history:  reports that she quit smoking about 22 years ago. Her smoking use included Cigarettes. She smoked 0.50 packs per day. She has never used smokeless tobacco. She reports that she drinks alcohol. She reports that she does not use drugs.  Medications:  Prior to Admission medications   Medication Sig Start Date End Date Taking? Authorizing Provider  albuterol (PROAIR HFA) 108 (90 BASE) MCG/ACT inhaler Inhale 2 puffs into the lungs every 6 (six) hours as needed for wheezing or shortness of breath.   Yes [provider]  albuterol (PROVENTIL) (2.5 MG/3ML) 0.083% nebulizer solution Take 2.5 mg by nebulization every 6 (six) hours as needed for wheezing or shortness of breath.   Yes [provider]  carbamazepine (TEGRETOL) 200 MG tablet 1 daily for 7 days, followed by 1 tablet twice daily 06/10/17  Yes Jacubowitz, Sam, MD  fluticasone furoate-vilanterol (BREO ELLIPTA) 100-25 MCG/INH AEPB Inhale 1-2 puffs into the lungs daily.  Yes [provider]  montelukast (SINGULAIR) 10 MG tablet Take 10 mg by mouth daily. 05/28/17  Yes [provider]  olopatadine (PATANOL) 0.1 % ophthalmic solution Place 1 drop into both eyes 2 (two) times daily. 05/20/17  Yes [provider]  pantoprazole (PROTONIX) 40 MG tablet Take 1 tablet (40 mg total) by mouth 2 (two) times daily. Patient taking differently: Take 40 mg by mouth daily.  05/08/15  Yes Kelvin Cellar, MD  phentermine (ADIPEX-P) 37.5 MG tablet Take 37.5 mg by  mouth daily before breakfast.   Yes [provider]  Respiratory Therapy Supplies (FLUTTER) DEVI Use as directed 05/31/15  Yes Tanda Rockers, MD  temazepam (RESTORIL) 30 MG capsule Take 30 mg by mouth at bedtime. 05/07/17  Yes [provider]  valsartan-hydrochlorothiazide (DIOVAN-HCT) 160-25 MG tablet Take 1 tablet by mouth daily. 12/02/15  Yes [provider]  topiramate (TOPAMAX) 50 MG tablet Take 50 mg by mouth daily. 03/06/17   [provider]      Allergies  Allergen Reactions  . Hydromet [Hydrocodone-Homatropine] Nausea And Vomiting  . Reglan [Metoclopramide] Other (See Comments)    Made her aggressive, made her feel crazy  . Prednisone Anxiety    Jittery    ROS:  Out of a complete 14 system review of symptoms, the patient complains only of the following symptoms, and all other reviewed systems are negative.  Weight gain Dizziness Blurred vision Shortness of breath, wheezing Constipation Feeling hot Headache  Blood pressure (!) 146/87, pulse 97, height 5\' 4"  (1.626 m), weight 243 lb (110.2 kg).  Physical Exam  General: The patient is alert and cooperative at the time of the examination. The patient is markedly obese.  Eyes: Pupils are equal, round, and reactive to light. Discs are flat bilaterally.  Ears: Tympanic membranes are clear bilaterally.  Neck: The neck is supple, no carotid bruits are noted.  Respiratory: The respiratory examination is clear.  Cardiovascular: The cardiovascular examination reveals a regular rate and rhythm, no obvious murmurs or rubs are noted.  Neuromuscular: Range of movement of the cervical spine is full, no crepitus is noted in the temporomandibular joints.  Skin: Extremities are without significant edema.  Neurologic Exam  Mental status: The patient is alert and oriented x 3 at the time of the examination. The patient has apparent normal recent and remote memory, with an apparently normal  attention span and concentration ability.  Cranial nerves: Facial symmetry is present. There is good sensation of the face to pinprick and soft touch bilaterally. The strength of the facial muscles and the muscles to head turning and shoulder shrug are normal bilaterally. Speech is well enunciated, no aphasia or dysarthria is noted. Extraocular movements are full. Visual fields are full. The tongue is midline, and the patient has symmetric elevation of the soft palate. No obvious hearing deficits are noted.  Motor: The motor testing reveals 5 over 5 strength of all 4 extremities. Good symmetric motor tone is noted throughout.  Sensory: Sensory testing is intact to pinprick, soft touch, vibration sensation, and position sense on all 4 extremities. No evidence of extinction is noted.  Coordination: Cerebellar testing reveals good finger-nose-finger and heel-to-shin bilaterally.  Gait and station: Gait is normal. Tandem gait is normal. Romberg is negative. No drift is seen.  Reflexes: Deep tendon reflexes are symmetric and normal bilaterally. Toes are downgoing bilaterally.   Assessment/Plan:  1. Migraine headache  2. Right temporal pain, possible ice pick pains  The patient has  been on Topamax at 50 mg dosing, she has not been on any higher dosing than this. We will get her back on Topamax starting at 50 mg for 2 weeks then 100 mg at night. She will call for any other dose adjustments. The patient will be sent for MRI brain evaluation, she will have blood work done today. She will follow-up in 3 months.  Jill Alexanders MD 06/16/2017 3:44 PM  [facility] [address] [place], [place] [address]  Phone [phone] Fax [phone]

## 2017-06-16 NOTE — Patient Instructions (Signed)
   With the Topamax, take 50 mg at night for 2 weeks, then take 100 mg at night.  Call for dose adjustments.  Topamax (topiramate) is a seizure medication that has an FDA approval for seizures and for migraine headache. Potential side effects of this medication include weight loss, cognitive slowing, tingling in the fingers and toes, and carbonated drinks will taste bad. If any significant side effects are noted on this drug, please contact our office.  We will get MRI of the brain.

## 2017-06-18 LAB — CBC WITH DIFFERENTIAL/PLATELET
BASOS: 0 %
Basophils Absolute: 0 10*3/uL (ref 0.0–0.2)
EOS (ABSOLUTE): 0.3 10*3/uL (ref 0.0–0.4)
EOS: 4 %
Hematocrit: 37.3 % (ref 34.0–46.6)
Hemoglobin: 12.2 g/dL (ref 11.1–15.9)
IMMATURE GRANS (ABS): 0 10*3/uL (ref 0.0–0.1)
IMMATURE GRANULOCYTES: 1 %
LYMPHS: 28 %
Lymphocytes Absolute: 1.8 10*3/uL (ref 0.7–3.1)
MCH: 27.3 pg (ref 26.6–33.0)
MCHC: 32.7 g/dL (ref 31.5–35.7)
MCV: 83 fL (ref 79–97)
MONOCYTES: 14 %
Monocytes Absolute: 0.9 10*3/uL (ref 0.1–0.9)
NEUTROS PCT: 53 %
Neutrophils Absolute: 3.4 10*3/uL (ref 1.4–7.0)
PLATELETS: 291 10*3/uL (ref 150–379)
RBC: 4.47 x10E6/uL (ref 3.77–5.28)
RDW: 15.8 % — ABNORMAL HIGH (ref 12.3–15.4)
WBC: 6.3 10*3/uL (ref 3.4–10.8)

## 2017-06-18 LAB — COMPREHENSIVE METABOLIC PANEL
ALT: 19 IU/L (ref 0–32)
AST: 19 IU/L (ref 0–40)
Albumin/Globulin Ratio: 1.5 (ref 1.2–2.2)
Albumin: 4.4 g/dL (ref 3.5–5.5)
Alkaline Phosphatase: 100 IU/L (ref 39–117)
BUN/Creatinine Ratio: 17 (ref 9–23)
BUN: 13 mg/dL (ref 6–24)
Bilirubin Total: <0.2 mg/dL (ref 0.0–1.2)
CALCIUM: 10 mg/dL (ref 8.7–10.2)
CO2: 27 mmol/L (ref 20–29)
CREATININE: 0.78 mg/dL (ref 0.57–1.00)
Chloride: 99 mmol/L (ref 96–106)
GFR calc Af Amer: 101 mL/min/{1.73_m2} (ref >59)
GFR, EST NON AFRICAN AMERICAN: 88 mL/min/{1.73_m2} (ref >59)
GLUCOSE: 110 mg/dL — AB (ref 65–99)
Globulin, Total: 2.9 g/dL (ref 1.5–4.5)
Potassium: 4.3 mmol/L (ref 3.5–5.2)
Sodium: 140 mmol/L (ref 134–144)
Total Protein: 7.3 g/dL (ref 6.0–8.5)

## 2017-06-18 LAB — SEDIMENTATION RATE: Sed Rate: 25 mm/hr (ref 0–40)

## 2017-07-05 ENCOUNTER — Other Ambulatory Visit: Payer: Managed Care, Other (non HMO)

## 2017-09-13 ENCOUNTER — Encounter (HOSPITAL_COMMUNITY): Payer: Self-pay | Admitting: Emergency Medicine

## 2017-09-13 ENCOUNTER — Emergency Department (HOSPITAL_COMMUNITY)
Admission: EM | Admit: 2017-09-13 | Discharge: 2017-09-14 | Disposition: A | Discharge status: Home / Self Care | Payer: Medicare HMO | Attending: Emergency Medicine | Admitting: Emergency Medicine

## 2017-09-13 DIAGNOSIS — R358 Other polyuria: Secondary | ICD-10-CM | POA: Diagnosis not present

## 2017-09-13 DIAGNOSIS — Z87891 Personal history of nicotine dependence: Secondary | ICD-10-CM | POA: Insufficient documentation

## 2017-09-13 DIAGNOSIS — R739 Hyperglycemia, unspecified: Secondary | ICD-10-CM | POA: Diagnosis not present

## 2017-09-13 DIAGNOSIS — R062 Wheezing: Secondary | ICD-10-CM | POA: Diagnosis present

## 2017-09-13 DIAGNOSIS — J45901 Unspecified asthma with (acute) exacerbation: Secondary | ICD-10-CM | POA: Diagnosis not present

## 2017-09-13 DIAGNOSIS — I10 Essential (primary) hypertension: Secondary | ICD-10-CM | POA: Diagnosis not present

## 2017-09-13 DIAGNOSIS — R631 Polydipsia: Secondary | ICD-10-CM | POA: Diagnosis not present

## 2017-09-13 DIAGNOSIS — Z7984 Long term (current) use of oral hypoglycemic drugs: Secondary | ICD-10-CM | POA: Insufficient documentation

## 2017-09-13 LAB — CBC
HCT: 36.4 % (ref 36.0–46.0)
Hemoglobin: 12.3 g/dL (ref 12.0–15.0)
MCH: 28.4 pg (ref 26.0–34.0)
MCHC: 33.8 g/dL (ref 30.0–36.0)
MCV: 84.1 fL (ref 78.0–100.0)
PLATELETS: 186 10*3/uL (ref 150–400)
RBC: 4.33 MIL/uL (ref 3.87–5.11)
RDW: 15.3 % (ref 11.5–15.5)
WBC: 9.2 10*3/uL (ref 4.0–10.5)

## 2017-09-13 LAB — BASIC METABOLIC PANEL
Anion gap: 10 (ref 5–15)
BUN: 13 mg/dL (ref 6–20)
CALCIUM: 9.3 mg/dL (ref 8.9–10.3)
CHLORIDE: 96 mmol/L — AB (ref 101–111)
CO2: 23 mmol/L (ref 22–32)
CREATININE: 1.05 mg/dL — AB (ref 0.44–1.00)
GFR, EST NON AFRICAN AMERICAN: 60 mL/min — AB (ref >60)
Glucose, Bld: 532 mg/dL (ref 65–99)
Potassium: 4.9 mmol/L (ref 3.5–5.1)
SODIUM: 129 mmol/L — AB (ref 135–145)

## 2017-09-13 LAB — URINALYSIS, ROUTINE W REFLEX MICROSCOPIC
Bacteria, UA: NONE SEEN
Bilirubin Urine: NEGATIVE
KETONES UR: NEGATIVE mg/dL
LEUKOCYTES UA: NEGATIVE
NITRITE: NEGATIVE
PROTEIN: NEGATIVE mg/dL
Specific Gravity, Urine: 1.037 — ABNORMAL HIGH (ref 1.005–1.030)
pH: 6 (ref 5.0–8.0)

## 2017-09-13 LAB — CBG MONITORING, ED
Glucose-Capillary: 487 mg/dL — ABNORMAL HIGH (ref 65–99)
Glucose-Capillary: 490 mg/dL — ABNORMAL HIGH (ref 65–99)

## 2017-09-13 MED ORDER — DEXTROSE-NACL 5-0.45 % IV SOLN: INTRAVENOUS | Status: DC

## 2017-09-13 MED ORDER — SODIUM CHLORIDE 0.9 % IV SOLN
INTRAVENOUS | Status: DC
  Administered 2017-09-14: 4.3 [IU]/h via INTRAVENOUS
  Filled 2017-09-13: qty 1

## 2017-09-13 MED ORDER — SODIUM CHLORIDE 0.9 % IV SOLN
INTRAVENOUS | Status: DC
  Administered 2017-09-14: 01:00:00 via INTRAVENOUS

## 2017-09-13 MED ORDER — ALBUTEROL SULFATE (2.5 MG/3ML) 0.083% IN NEBU
5.0000 mg | INHALATION_SOLUTION | Freq: Once | RESPIRATORY_TRACT | Status: AC
  Administered 2017-09-13: 5 mg via RESPIRATORY_TRACT
  Filled 2017-09-13: qty 6

## 2017-09-13 MED ORDER — SODIUM CHLORIDE 0.9 % IV BOLUS (SEPSIS)
1000.0000 mL | Freq: Once | INTRAVENOUS | Status: AC
  Administered 2017-09-13: 1000 mL via INTRAVENOUS

## 2017-09-13 NOTE — ED Notes (Signed)
RN attempted to start IV with no success.  Pt reports multiple bouts of urination and extreme thirst. Pt inofrmed RN that she "is not really a diabetic"

## 2017-09-13 NOTE — ED Triage Notes (Signed)
Patient reports increased urination today. Reports hx of "pre diabetes" and took blood sugar at home that read "high." Patient also reports wheezing that began upon entering hospital. Reports "I think I may just be worked up." Speaking in full sentences.

## 2017-09-13 NOTE — ED Provider Notes (Signed)
[facility] DEPT Provider Note   CSN: [id number] Arrival date & time: 09/13/17  2109     History   Chief Complaint Chief Complaint  Patient presents with  . Hyperglycemia  . Wheezing    HPI Allison Guerra is a 52 y.o. female with history of asthma, GERD, hypertension who presents with hyperglycemia associated with 3-day history of polyuria and polydipsia.  Patient developed shortness of breath and wheezing on arrival, but is now resolved after albuterol nebulizer given in triage.  She feels it may have started because she was worked up.  She states she has been told she was pre-diabetic and to watch her diet but she is not currently on any medications for diabetes.  She denies any chest pain, current shortness of breath, abdominal pain, nausea, vomiting.  HPI  Past Medical History:  Diagnosis Date  . Anxiety   . Asthma   . Chronic insomnia 06/16/2017  . Common migraine with intractable migraine 06/16/2017  . Depression   . GERD (gastroesophageal reflux disease)   . H/O multiple allergies    states "seasonal allergies last all year"  . Headache   . Hypertension   . Pneumonia   . Sleep apnea   . Vocal cord dysfunction     Patient Active Problem List   Diagnosis Date Noted  . Chronic insomnia 06/16/2017  . Common migraine with intractable migraine 06/16/2017  . 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 Chest pain 01/06/2016  . Asthma exacerbation 01/06/2016  . Musculoskeletal pain 01/06/2016  . Pleuritic chest pain 01/06/2016  . GERD (gastroesophageal reflux disease) 01/06/2016  . Pain in the chest   . Upper airway cough syndrome ? with element of cough variant asthma  05/18/2015  . Severe obesity (BMI >= 40) ([place]) 05/18/2015  . Bacteremia 05/02/2015  . Prediabetes 05/02/2015  . CAP (community acquired pneumonia) 04/30/2015  . SOB (shortness of breath) 04/30/2015  . Constipation 04/30/2015  . Elevated blood sugar 04/30/2015  . Hypertension   .  Sleep apnea   . Vocal cord dysfunction   . Anxiety     Past Surgical History:  Procedure Laterality Date  . PARTIAL HYSTERECTOMY     08/14/16-  states unsure if uterus was removed  . ROBOTIC ASSITED PARTIAL NEPHRECTOMY Left 08/19/2016   Procedure: XI ROBOTIC ASSITED PARTIAL NEPHRECTOMY WITH ULTRASOUND; LYSIS OF ADHESIONS; WITH FLEXIBLE CYSTOSCOPY;  Surgeon: Alexis Frock, MD;  Location: [facility];  Service: Urology;  Laterality: Left;  . uterine ablation    . UTERINE FIBROID SURGERY      OB History    No data available       Home Medications    Prior to Admission medications   Medication Sig Start Date End Date Taking? Authorizing Provider  albuterol (PROAIR HFA) 108 (90 BASE) MCG/ACT inhaler Inhale 2 puffs into the lungs every 6 (six) hours as needed for wheezing or shortness of breath.   Yes [provider]  fluticasone furoate-vilanterol (BREO ELLIPTA) 100-25 MCG/INH AEPB Inhale 1-2 puffs into the lungs daily.   Yes [provider]  montelukast (SINGULAIR) 10 MG tablet Take 10 mg by mouth daily. 05/28/17  Yes [provider]  pantoprazole (PROTONIX) 40 MG tablet Take 1 tablet (40 mg total) by mouth 2 (two) times daily. Patient taking differently: Take 40 mg by mouth daily.  05/08/15  Yes Kelvin Cellar, MD  valsartan-hydrochlorothiazide (DIOVAN-HCT) 160-25 MG tablet Take 1 tablet by mouth daily. 12/02/15  Yes [provider]  albuterol (PROVENTIL) (  2.5 MG/3ML) 0.083% nebulizer solution Take 2.5 mg by nebulization every 6 (six) hours as needed for wheezing or shortness of breath.    [provider]  carbamazepine (TEGRETOL) 200 MG tablet 1 daily for 7 days, followed by 1 tablet twice daily Patient not taking: Reported on 09/13/2017 06/10/17   Orlie Dakin, MD  chlorhexidine (PERIDEX) 0.12 % solution RINSE WITH 15ML AM & PM FOR TWO WEEKS 08/04/17   [provider]  HYDROcodone-acetaminophen (NORCO/VICODIN) 5-325 MG tablet Take 1  tablet by mouth every 6 (six) hours as needed. for pain 08/04/17   [provider]  ibuprofen (ADVIL,MOTRIN) 600 MG tablet 1 TABLET BY MOUTH EVERY 6HRS, NOT TO EXCEED 3200MG  PER DAY. 08/04/17   [provider]  metFORMIN (GLUCOPHAGE) 500 MG tablet Take 1 tablet (500 mg total) by mouth 2 (two) times daily with a meal. 09/14/17   Jahniya Duzan M, PA-C  methylPREDNISolone (MEDROL DOSEPAK) 4 MG TBPK tablet 1 (ONE) PACKAGE USE AS DIRECTED PER INSTRUCTIONS IN PACK 08/31/17   [provider]  olopatadine (PATANOL) 0.1 % ophthalmic solution Place 1 drop into both eyes 2 (two) times daily. 05/20/17   [provider]  penicillin v potassium (VEETID) 500 MG tablet TAKE 1 TABLET BY MOUTH FOUR TIMES A DAY UNTIL FINISHED 08/04/17   [provider]  phentermine (ADIPEX-P) 37.5 MG tablet Take 37.5 mg by mouth daily before breakfast.    [provider]  Respiratory Therapy Supplies (FLUTTER) DEVI Use as directed 05/31/15   Tanda Rockers, MD  temazepam (RESTORIL) 30 MG capsule Take 30 mg by mouth at bedtime as needed for sleep.  05/07/17   [provider]  topiramate (TOPAMAX) 50 MG tablet One tablet at night for 2 weeks, then take 2 tablets at night Patient taking differently: Take 25 mg by mouth daily as needed (headache).  06/16/17   Kathrynn Ducking, MD    Family History Family History  Problem Relation Age of Onset  . Cancer - Lung Mother   . Hypertension Father   . Pneumonia Father   . Hypertension Sister     Social History Social History   Tobacco Use  . Smoking status: Former Smoker    Packs/day: 0.50    Types: Cigarettes    Last attempt to quit: 08/24/1994    Years since quitting: 23.0  . Smokeless tobacco: Never Used  Substance Use Topics  . Alcohol use: Yes    Comment: occasionally  . Drug use: No     Allergies   Hydromet [hydrocodone-homatropine]; Reglan [metoclopramide]; and Prednisone   Review of Systems Review of  Systems  Constitutional: Negative for chills and fever.  HENT: Negative for facial swelling and sore throat.   Respiratory: Positive for shortness of breath and wheezing.   Cardiovascular: Negative for chest pain.  Gastrointestinal: Negative for abdominal pain, nausea and vomiting.  Endocrine: Positive for polydipsia and polyuria.  Genitourinary: Negative for dysuria.  Musculoskeletal: Negative for back pain.  Skin: Negative for rash and wound.  Neurological: Negative for headaches.  Psychiatric/Behavioral: The patient is not nervous/anxious.      Physical Exam Updated Vital Signs BP 140/77 (BP Location: Right Arm)   Pulse 89   Temp 98 F (36.7 C) (Oral)   Resp 16   SpO2 98%   Physical Exam  Constitutional: She appears well-developed and well-nourished. No distress.  HENT:  Head: Normocephalic and atraumatic.  Mouth/Throat: Oropharynx is clear and moist. No oropharyngeal exudate.  Eyes: Conjunctivae are  normal. Pupils are equal, round, and reactive to light. Right eye exhibits no discharge. Left eye exhibits no discharge. No scleral icterus.  Neck: Normal range of motion. Neck supple. No thyromegaly present.  Cardiovascular: Normal rate, regular rhythm, normal heart sounds and intact distal pulses. Exam reveals no gallop and no friction rub.  No murmur heard. Pulmonary/Chest: Effort normal and breath sounds normal. No stridor. No respiratory distress. She has no wheezes. She has no rales.  Abdominal: Soft. Bowel sounds are normal. She exhibits no distension. There is tenderness (mild) in the epigastric area. There is no rebound and no guarding.  Musculoskeletal: She exhibits no edema.  Lymphadenopathy:    She has no cervical adenopathy.  Neurological: She is alert. Coordination normal.  Skin: Skin is warm and dry. No rash noted. She is not diaphoretic. No pallor.  Psychiatric: She has a normal mood and affect.  Nursing note and vitals reviewed.    ED Treatments / Results    Labs (all labs ordered are listed, but only abnormal results are displayed) Labs Reviewed  BASIC METABOLIC PANEL - Abnormal; Notable for the following components:      Result Value   Sodium 129 (*)    Chloride 96 (*)    Glucose, Bld 532 (*)    Creatinine, Ser 1.05 (*)    GFR calc non Af Amer 60 (*)    All other components within normal limits  URINALYSIS, ROUTINE W REFLEX MICROSCOPIC - Abnormal; Notable for the following components:   Color, Urine STRAW (*)    Specific Gravity, Urine 1.037 (*)    Glucose, UA >=500 (*)    Hgb urine dipstick SMALL (*)    Squamous Epithelial / LPF 0-5 (*)    All other components within normal limits  CBG MONITORING, ED - Abnormal; Notable for the following components:   Glucose-Capillary >600 (*)    All other components within normal limits  CBG MONITORING, ED - Abnormal; Notable for the following components:   Glucose-Capillary 490 (*)    All other components within normal limits  CBG MONITORING, ED - Abnormal; Notable for the following components:   Glucose-Capillary 487 (*)    All other components within normal limits  CBG MONITORING, ED - Abnormal; Notable for the following components:   Glucose-Capillary 369 (*)    All other components within normal limits  CBG MONITORING, ED - Abnormal; Notable for the following components:   Glucose-Capillary 304 (*)    All other components within normal limits  CBG MONITORING, ED - Abnormal; Notable for the following components:   Glucose-Capillary 308 (*)    All other components within normal limits  CBG MONITORING, ED - Abnormal; Notable for the following components:   Glucose-Capillary 178 (*)    All other components within normal limits  CBG MONITORING, ED - Abnormal; Notable for the following components:   Glucose-Capillary 183 (*)    All other components within normal limits  CBC  I-STAT BETA HCG BLOOD, ED ([facility], [facility], [facility] ONLY)    EKG  EKG Interpretation None       Radiology No results  found.  Procedures Procedures (including critical care time)  CRITICAL CARE Performed by: Frederica Kuster   Total critical care time: 30 minutes  Critical care time was exclusive of separately billable procedures and treating other patients.  Critical care was necessary to treat or prevent imminent or life-threatening deterioration.  Critical care was time spent personally by me on the following activities: development of  treatment plan with patient and/or surrogate as well as nursing, discussions with consultants, evaluation of patient's response to treatment, examination of patient, obtaining history from patient or surrogate, ordering and performing treatments and interventions, ordering and review of laboratory studies, ordering and review of radiographic studies, pulse oximetry and re-evaluation of patient's condition.   Medications Ordered in ED Medications  dextrose 5 %-0.45 % sodium chloride infusion ( Intravenous Hold 09/14/17 0035)  insulin regular (NOVOLIN R,HUMULIN R) 100 Units in sodium chloride 0.9 % 100 mL (1 Units/mL) infusion ( Intravenous Stopped 09/14/17 0642)  0.9 %  sodium chloride infusion ( Intravenous Stopped 09/14/17 0642)  albuterol (PROVENTIL) (2.5 MG/3ML) 0.083% nebulizer solution 5 mg (5 mg Nebulization Given 09/13/17 2142)  sodium chloride 0.9 % bolus 1,000 mL (0 mLs Intravenous Stopped 09/14/17 0247)  metFORMIN (GLUCOPHAGE) tablet 500 mg (500 mg Oral Given 09/14/17 0247)  ketorolac (TORADOL) 30 MG/ML injection 30 mg (30 mg Intravenous Given 09/14/17 0458)  prochlorperazine (COMPAZINE) injection 5 mg (5 mg Intravenous Given 09/14/17 0458)  diphenhydrAMINE (BENADRYL) injection 25 mg (25 mg Intravenous Given 09/14/17 0458)     Initial Impression / Assessment and Plan / ED Course  I have reviewed the triage vital signs and the nursing notes.  Pertinent labs & imaging results that were available during my care of the patient were reviewed by me and considered in my  medical decision making (see chart for details).     Patient with hyperglycemia with new onset diabetes.  No DKA.  Labs are reassuring except for glucose level over 500.  Patient placed on glucose stabilizer and fluids and glucose level decreased to more acceptable range.  Metformin also given in the ED.  Patient developed headache (history of migraine) and nausea in the ED, which was resolved with Toradol and Compazine.  Patient has follow-up appointment with PCP tomorrow.  Will discharge home with metformin twice daily until patient can see her PCP.  Advised her to call today to make them aware of her visit today.  Strict return precautions discussed.  Patient understands and agrees with plan.  Patient vitals stable and discharged in satisfactory condition.  Patient also evaluated by Dr. Kathrynn Humble who guided the patient's management and agrees with plan.  Final Clinical Impressions(s) / ED Diagnoses   Final diagnoses:  Hyperglycemia  Wheezing    ED Discharge Orders        Ordered    metFORMIN (GLUCOPHAGE) 500 MG tablet  2 times daily with meals     09/14/17 0634       Frederica Kuster, PA-C 09/14/17 9735    Varney Biles, MD 09/14/17 1157

## 2017-09-14 ENCOUNTER — Ambulatory Visit: Payer: 59 | Admitting: Nurse Practitioner

## 2017-09-14 LAB — CBG MONITORING, ED
GLUCOSE-CAPILLARY: 308 mg/dL — AB (ref 65–99)
Glucose-Capillary: 369 mg/dL — ABNORMAL HIGH (ref 65–99)
Glucose-Capillary: 304 mg/dL — ABNORMAL HIGH (ref 65–99)
Glucose-Capillary: 178 mg/dL — ABNORMAL HIGH (ref 65–99)
Glucose-Capillary: 183 mg/dL — ABNORMAL HIGH (ref 65–99)

## 2017-09-14 MED ORDER — METFORMIN HCL 500 MG PO TABS: 500.0000 mg | ORAL_TABLET | Freq: Two times a day (BID) | ORAL | 0 refills | Status: AC

## 2017-09-14 MED ORDER — METFORMIN HCL 500 MG PO TABS
500.0000 mg | ORAL_TABLET | Freq: Once | ORAL | Status: AC
  Administered 2017-09-14: 500 mg via ORAL
  Filled 2017-09-14: qty 1

## 2017-09-14 MED ORDER — DIPHENHYDRAMINE HCL 50 MG/ML IJ SOLN
25.0000 mg | Freq: Once | INTRAMUSCULAR | Status: AC
  Administered 2017-09-14: 25 mg via INTRAVENOUS
  Filled 2017-09-14: qty 1

## 2017-09-14 MED ORDER — PROCHLORPERAZINE EDISYLATE 5 MG/ML IJ SOLN
5.0000 mg | Freq: Once | INTRAMUSCULAR | Status: AC
  Administered 2017-09-14: 5 mg via INTRAVENOUS
  Filled 2017-09-14: qty 2

## 2017-09-14 MED ORDER — KETOROLAC TROMETHAMINE 30 MG/ML IJ SOLN
30.0000 mg | Freq: Once | INTRAMUSCULAR | Status: AC
  Administered 2017-09-14: 30 mg via INTRAVENOUS
  Filled 2017-09-14: qty 1

## 2017-09-14 NOTE — Discharge Instructions (Signed)
Begin taking metformin twice daily.  Please follow-up with your doctor within 1-2 days for further evaluation and treatment of your new onset diabetes. Please return to the emergency department if you develop any new or worsening symptoms.

## 2017-10-22 ENCOUNTER — Other Ambulatory Visit: Payer: Self-pay | Admitting: Obstetrics and Gynecology

## 2017-11-25 ENCOUNTER — Other Ambulatory Visit: Payer: Self-pay | Admitting: Gastroenterology

## 2017-11-25 DIAGNOSIS — R1310 Dysphagia, unspecified: Secondary | ICD-10-CM

## 2017-11-30 ENCOUNTER — Other Ambulatory Visit: Payer: 59

## 2017-12-02 ENCOUNTER — Other Ambulatory Visit: Payer: 59

## 2017-12-03 ENCOUNTER — Ambulatory Visit
Admission: RE | Admit: 2017-12-03 | Discharge: 2017-12-03 | Disposition: A | Discharge status: Home / Self Care | Payer: 59 | Source: Ambulatory Visit | Attending: Gastroenterology | Admitting: Gastroenterology

## 2017-12-03 DIAGNOSIS — R1310 Dysphagia, unspecified: Secondary | ICD-10-CM

## 2017-12-27 ENCOUNTER — Encounter (HOSPITAL_COMMUNITY): Payer: Self-pay

## 2017-12-27 ENCOUNTER — Emergency Department (HOSPITAL_COMMUNITY): Payer: Medicare HMO

## 2017-12-27 ENCOUNTER — Emergency Department (HOSPITAL_COMMUNITY)
Admission: EM | Admit: 2017-12-27 | Discharge: 2017-12-28 | Disposition: A | Discharge status: Home / Self Care | Payer: Medicare HMO | Attending: Emergency Medicine | Admitting: Emergency Medicine

## 2017-12-27 DIAGNOSIS — Y999 Unspecified external cause status: Secondary | ICD-10-CM | POA: Diagnosis not present

## 2017-12-27 DIAGNOSIS — M545 Low back pain: Secondary | ICD-10-CM | POA: Insufficient documentation

## 2017-12-27 DIAGNOSIS — S161XXA Strain of muscle, fascia and tendon at neck level, initial encounter: Secondary | ICD-10-CM | POA: Diagnosis not present

## 2017-12-27 DIAGNOSIS — Y939 Activity, unspecified: Secondary | ICD-10-CM | POA: Insufficient documentation

## 2017-12-27 DIAGNOSIS — J4521 Mild intermittent asthma with (acute) exacerbation: Secondary | ICD-10-CM | POA: Diagnosis not present

## 2017-12-27 DIAGNOSIS — S20212A Contusion of left front wall of thorax, initial encounter: Secondary | ICD-10-CM | POA: Insufficient documentation

## 2017-12-27 DIAGNOSIS — R51 Headache: Secondary | ICD-10-CM | POA: Diagnosis not present

## 2017-12-27 DIAGNOSIS — Y929 Unspecified place or not applicable: Secondary | ICD-10-CM | POA: Insufficient documentation

## 2017-12-27 DIAGNOSIS — Z041 Encounter for examination and observation following transport accident: Secondary | ICD-10-CM | POA: Diagnosis present

## 2017-12-27 MED ORDER — IPRATROPIUM-ALBUTEROL 0.5-2.5 (3) MG/3ML IN SOLN
3.0000 mL | Freq: Once | RESPIRATORY_TRACT | Status: AC
Start: 2017-12-27 — End: 2017-12-28
  Administered 2017-12-28: 3 mL via RESPIRATORY_TRACT
  Filled 2017-12-27: qty 3

## 2017-12-27 MED ORDER — MORPHINE SULFATE (PF) 4 MG/ML IV SOLN
4.0000 mg | Freq: Once | INTRAVENOUS | Status: DC
  Filled 2017-12-27: qty 1

## 2017-12-27 MED ORDER — IBUPROFEN 800 MG PO TABS
800.0000 mg | ORAL_TABLET | Freq: Once | ORAL | Status: AC
  Administered 2017-12-27: 800 mg via ORAL
  Filled 2017-12-27: qty 1

## 2017-12-27 MED ORDER — ALBUTEROL SULFATE (2.5 MG/3ML) 0.083% IN NEBU
2.5000 mg | INHALATION_SOLUTION | Freq: Once | RESPIRATORY_TRACT | Status: AC
  Administered 2017-12-28: 2.5 mg via RESPIRATORY_TRACT
  Filled 2017-12-27: qty 3

## 2017-12-27 MED ORDER — ONDANSETRON HCL 4 MG/2ML IJ SOLN
4.0000 mg | Freq: Once | INTRAMUSCULAR | Status: AC
  Administered 2017-12-28: 4 mg via INTRAVENOUS
  Filled 2017-12-27: qty 2

## 2017-12-27 NOTE — ED Notes (Signed)
On the way back to the room the patients states that she needs a breathing treatment because she has started wheezing
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
## 2017-12-28 MED ORDER — TRAMADOL HCL 50 MG PO TABS: 50.0000 mg | ORAL_TABLET | Freq: Four times a day (QID) | ORAL | 0 refills | Status: AC | PRN

## 2017-12-28 MED ORDER — CYCLOBENZAPRINE HCL 10 MG PO TABS: 10.0000 mg | ORAL_TABLET | Freq: Three times a day (TID) | ORAL | 0 refills | Status: AC | PRN

## 2017-12-28 MED ORDER — IBUPROFEN 800 MG PO TABS: 800.0000 mg | ORAL_TABLET | Freq: Three times a day (TID) | ORAL | 0 refills | Status: AC | PRN

## 2017-12-28 MED ORDER — CYCLOBENZAPRINE HCL 10 MG PO TABS
5.0000 mg | ORAL_TABLET | Freq: Once | ORAL | Status: AC
  Administered 2017-12-28: 5 mg via ORAL
  Filled 2017-12-28: qty 1

## 2017-12-28 MED ORDER — KETOROLAC TROMETHAMINE 30 MG/ML IJ SOLN
15.0000 mg | Freq: Once | INTRAMUSCULAR | Status: AC
  Administered 2017-12-28: 15 mg via INTRAVENOUS
  Filled 2017-12-28: qty 1

## 2017-12-28 NOTE — ED Notes (Signed)
Explain to pt can not drive for 4 hours if given morphine for pain  Pt very unhappy  Explain to pt can provide warm blankets and comfort  measures in the lobby on discharge  Pt demands to speak with charge and doctor  Michela Pitcher I was sorry  to pt and did my best to help.
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
## 2017-12-28 NOTE — ED Notes (Signed)
Spoke with provider d/c ekg

## 2018-06-24 ENCOUNTER — Emergency Department (HOSPITAL_COMMUNITY)
Admission: EM | Admit: 2018-06-24 | Discharge: 2018-06-24 | Disposition: A | Discharge status: Home / Self Care | Payer: Medicare HMO | Attending: Emergency Medicine | Admitting: Emergency Medicine

## 2018-06-24 ENCOUNTER — Other Ambulatory Visit: Payer: Self-pay

## 2018-06-24 ENCOUNTER — Encounter (HOSPITAL_COMMUNITY): Payer: Self-pay | Admitting: Emergency Medicine

## 2018-06-24 ENCOUNTER — Emergency Department (HOSPITAL_COMMUNITY): Payer: Medicare HMO

## 2018-06-24 DIAGNOSIS — I10 Essential (primary) hypertension: Secondary | ICD-10-CM | POA: Diagnosis not present

## 2018-06-24 DIAGNOSIS — Z7984 Long term (current) use of oral hypoglycemic drugs: Secondary | ICD-10-CM | POA: Insufficient documentation

## 2018-06-24 DIAGNOSIS — Z87891 Personal history of nicotine dependence: Secondary | ICD-10-CM | POA: Insufficient documentation

## 2018-06-24 DIAGNOSIS — R200 Anesthesia of skin: Secondary | ICD-10-CM | POA: Diagnosis not present

## 2018-06-24 DIAGNOSIS — R0602 Shortness of breath: Secondary | ICD-10-CM | POA: Diagnosis not present

## 2018-06-24 DIAGNOSIS — R519 Headache, unspecified: Secondary | ICD-10-CM

## 2018-06-24 DIAGNOSIS — Z79899 Other long term (current) drug therapy: Secondary | ICD-10-CM | POA: Insufficient documentation

## 2018-06-24 DIAGNOSIS — J45909 Unspecified asthma, uncomplicated: Secondary | ICD-10-CM | POA: Insufficient documentation

## 2018-06-24 DIAGNOSIS — R0789 Other chest pain: Secondary | ICD-10-CM

## 2018-06-24 DIAGNOSIS — R51 Headache: Secondary | ICD-10-CM | POA: Diagnosis not present

## 2018-06-24 DIAGNOSIS — R079 Chest pain, unspecified: Secondary | ICD-10-CM | POA: Diagnosis present

## 2018-06-24 LAB — CBC
HCT: 41 % (ref 36.0–46.0)
HEMOGLOBIN: 12.4 g/dL (ref 12.0–15.0)
MCH: 25.9 pg — AB (ref 26.0–34.0)
MCHC: 30.2 g/dL (ref 30.0–36.0)
MCV: 85.6 fL (ref 80.0–100.0)
NRBC: 0 % (ref 0.0–0.2)
PLATELETS: 312 10*3/uL (ref 150–400)
RBC: 4.79 MIL/uL (ref 3.87–5.11)
RDW: 15.4 % (ref 11.5–15.5)
WBC: 7.4 10*3/uL (ref 4.0–10.5)

## 2018-06-24 LAB — BASIC METABOLIC PANEL
ANION GAP: 8 (ref 5–15)
BUN: 7 mg/dL (ref 6–20)
CHLORIDE: 100 mmol/L (ref 98–111)
CO2: 28 mmol/L (ref 22–32)
Calcium: 10.3 mg/dL (ref 8.9–10.3)
Creatinine, Ser: 0.86 mg/dL (ref 0.44–1.00)
GFR calc Af Amer: >60 mL/min (ref >60)
Glucose, Bld: 104 mg/dL — ABNORMAL HIGH (ref 70–99)
POTASSIUM: 3.4 mmol/L — AB (ref 3.5–5.1)
SODIUM: 136 mmol/L (ref 135–145)

## 2018-06-24 LAB — I-STAT TROPONIN, ED
TROPONIN I, POC: 0 ng/mL (ref 0.00–0.08)
Troponin i, poc: 0 ng/mL (ref 0.00–0.08)

## 2018-06-24 MED ORDER — MECLIZINE HCL 12.5 MG PO TABS: 12.5000 mg | ORAL_TABLET | Freq: Three times a day (TID) | ORAL | 0 refills | Status: AC | PRN

## 2018-06-24 MED ORDER — ALBUTEROL SULFATE (2.5 MG/3ML) 0.083% IN NEBU
5.0000 mg | INHALATION_SOLUTION | Freq: Once | RESPIRATORY_TRACT | Status: AC
  Administered 2018-06-24: 5 mg via RESPIRATORY_TRACT

## 2018-06-24 MED ORDER — ALBUTEROL SULFATE (2.5 MG/3ML) 0.083% IN NEBU
INHALATION_SOLUTION | RESPIRATORY_TRACT | Status: AC
  Administered 2018-06-24: 5 mg via RESPIRATORY_TRACT
  Filled 2018-06-24: qty 6

## 2018-06-24 NOTE — ED Provider Notes (Signed)
Patient placed in Quick Look pathway, seen and evaluated   Chief Complaint: Chest pain and SOB  HPI:  Pt with a history of HTN, obesity, prediabetes and asthma presents with sharp chest pain and SOB. Symptoms started a week ago, got worse last night. States pain radiates to her left arm which feels numb. Pain is 7/10 currently. She has associated dizziness.  States she has had a non-productive cough, no fever. She has been wheezing. Using breo inhaler and albuterol neb  ROS: + wheezing  Physical Exam:   Gen: No distress  Neuro: Awake and Alert  Skin: Warm    Focused Exam No respiratory distress. Lungs CTA. Heart rate regular, rhythm normal.    Initiation of care has begun. The patient has been counseled on the process, plan, and necessity for staying for the completion/evaluation, and the remainder of the medical screening examination    Allison Guerra 06/24/18 1419    Quintella Reichert, MD 06/26/18 [phone]

## 2018-06-24 NOTE — ED Notes (Signed)
Albuterol given

## 2018-06-24 NOTE — ED Notes (Signed)
Gave pt food and drink per PA request.

## 2018-06-24 NOTE — ED Notes (Signed)
Attempted blood draw; no success.  

## 2018-06-24 NOTE — ED Provider Notes (Signed)
[place] EMERGENCY DEPARTMENT Provider Note   CSN: [id number] Arrival date & time: 06/24/18  1303     History   Chief Complaint Chief Complaint  Patient presents with  . Chest Pain  . Shortness of Breath  . Numbness    HPI Allison Guerra is a 52 y.o. female with history of GERD, migraines, hypertension, vocal cord dysfunction, OSA, anxiety, asthma presents for evaluation of acute onset, progressively worsening chest pain and dizziness for 1 week.  She states that she has been experiencing both symptoms for longer but they have been worsening over the past week.  Pain in the chest is sharp, worse on the left as compared to the right but overall generalized.  She has numbness and tingling of her left upper extremity.  She notes associated shortness of breath and lightheadedness with position changes but denies nausea or vomiting.  She is a non-smoker, no recent travel or surgeries, no hemoptysis, no prior history of DVT or PE.  She is not on HRT. She has been using her Breo inhaler and albuterol nebulizers with some improvement.  She states that dizziness has been ongoing for a few months and used to be related to looking up quickly but states it is now random and can last for a few hours at worst.  Does note some associated frontal headaches which she has experienced in the past.  States her PCP has prescribed a medication for her but does not remember what it is and states it has not been helpful.  She has a history of migraines and is currently being treated with a new medication that she cannot remember the name of.  Denies any vision changes, weakness, slurred speech.  Denies recreational drug use or significant alcohol intake.  The history is provided by the patient.    Past Medical History:  Diagnosis Date  . Anxiety   . Asthma   . Chronic insomnia 06/16/2017  . Common migraine with intractable migraine 06/16/2017  . Depression   . GERD (gastroesophageal  reflux disease)   . H/O multiple allergies    states "seasonal allergies last all year"  . Headache   . Hypertension   . Pneumonia   . Sleep apnea   . Vocal cord dysfunction     Patient Active Problem List   Diagnosis Date Noted  . Chronic insomnia 06/16/2017  . Common migraine with intractable migraine 06/16/2017  . 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 Chest pain 01/06/2016  . Asthma exacerbation 01/06/2016  . Musculoskeletal pain 01/06/2016  . Pleuritic chest pain 01/06/2016  . GERD (gastroesophageal reflux disease) 01/06/2016  . Pain in the chest   . Upper airway cough syndrome ? with element of cough variant asthma  05/18/2015  . Severe obesity (BMI >= 40) ([place]) 05/18/2015  . Bacteremia 05/02/2015  . Prediabetes 05/02/2015  . CAP (community acquired pneumonia) 04/30/2015  . SOB (shortness of breath) 04/30/2015  . Constipation 04/30/2015  . Elevated blood sugar 04/30/2015  . Hypertension   . Sleep apnea   . Vocal cord dysfunction   . Anxiety     Past Surgical History:  Procedure Laterality Date  . PARTIAL HYSTERECTOMY     08/14/16-  states unsure if uterus was removed  . ROBOTIC ASSITED PARTIAL NEPHRECTOMY Left 08/19/2016   Procedure: XI ROBOTIC ASSITED PARTIAL NEPHRECTOMY WITH ULTRASOUND; LYSIS OF ADHESIONS; WITH FLEXIBLE CYSTOSCOPY;  Surgeon: Alexis Frock, MD;  Location: [facility];  Service: Urology;  Laterality: Left;  . uterine ablation    .  UTERINE FIBROID SURGERY       OB History   None      Home Medications    Prior to Admission medications   Medication Sig Start Date End Date Taking? Authorizing Provider  albuterol (PROAIR HFA) 108 (90 BASE) MCG/ACT inhaler Inhale 2 puffs into the lungs every 6 (six) hours as needed for wheezing or shortness of breath.   Yes [provider]  albuterol (PROVENTIL) (2.5 MG/3ML) 0.083% nebulizer solution Take 2.5 mg by nebulization every 6 (six) hours as needed for wheezing or shortness of breath.   Yes [provider]  fluticasone furoate-vilanterol (BREO ELLIPTA) 100-25 MCG/INH AEPB Inhale 1 puff into the lungs daily.    Yes [provider]  ibuprofen (ADVIL,MOTRIN) 800 MG tablet Take 1 tablet (800 mg total) by mouth every 8 (eight) hours as needed for moderate pain. 12/28/17  Yes Pollina, Gwenyth Allegra, MD  metFORMIN (GLUCOPHAGE) 500 MG tablet Take 1 tablet (500 mg total) by mouth 2 (two) times daily with a meal. 09/14/17  Yes Law, Alexandra M, PA-C  montelukast (SINGULAIR) 10 MG tablet Take 10 mg by mouth daily. 05/28/17  Yes [provider]  pantoprazole (PROTONIX) 40 MG tablet Take 1 tablet (40 mg total) by mouth 2 (two) times daily. Patient taking differently: Take 40 mg by mouth daily.  05/08/15  Yes Kelvin Cellar, MD  phentermine (ADIPEX-P) 37.5 MG tablet Take 37.5 mg by mouth daily before breakfast.   Yes [provider]  Respiratory Therapy Supplies (FLUTTER) DEVI Use as directed 05/31/15  Yes Tanda Rockers, MD  valsartan-hydrochlorothiazide (DIOVAN-HCT) 160-25 MG tablet Take 1 tablet by mouth daily. 12/02/15  Yes [provider]  zolpidem (AMBIEN) 10 MG tablet Take 10 mg by mouth at bedtime. 05/06/18  Yes [provider]  carbamazepine (TEGRETOL) 200 MG tablet 1 daily for 7 days, followed by 1 tablet twice daily Patient not taking: Reported on 09/13/2017 06/10/17   Orlie Dakin, MD  cyclobenzaprine (FLEXERIL) 10 MG tablet Take 1 tablet (10 mg total) by mouth 3 (three) times daily as needed for muscle spasms. Patient not taking: Reported on 06/24/2018 12/28/17   Orpah Greek, MD  Dexlansoprazole (DEXILANT) 30 MG capsule Take 30 mg by mouth daily.    [provider]  meclizine (ANTIVERT) 12.5 MG tablet Take 1 tablet (12.5 mg total) by mouth 3 (three) times daily as needed for dizziness. 06/24/18   Nils Flack, Marilou Barnfield A, PA-C  topiramate (TOPAMAX) 50 MG tablet One tablet at night for 2 weeks, then take 2 tablets at night Patient not  taking: Reported on 12/28/2017 06/16/17   Kathrynn Ducking, MD  traMADol (ULTRAM) 50 MG tablet Take 1 tablet (50 mg total) by mouth every 6 (six) hours as needed. Patient not taking: Reported on 06/24/2018 12/28/17   Orpah Greek, MD    Family History Family History  Problem Relation Age of Onset  . Cancer - Lung Mother   . Hypertension Father   . Pneumonia Father   . Hypertension Sister     Social History Social History   Tobacco Use  . Smoking status: Former Smoker    Packs/day: 0.50    Types: Cigarettes    Last attempt to quit: 08/24/1994    Years since quitting: 23.8  . Smokeless tobacco: Never Used  Substance Use Topics  . Alcohol use: Yes    Comment: occasionally  . Drug use: No     Allergies   Hydromet [hydrocodone-homatropine]; Reglan [metoclopramide];  and Prednisone   Review of Systems Review of Systems  Constitutional: Negative for chills and fever.  Eyes: Negative for visual disturbance.  Respiratory: Positive for shortness of breath.   Cardiovascular: Positive for chest pain.  Gastrointestinal: Negative for abdominal pain, nausea and vomiting.  Neurological: Positive for dizziness, light-headedness and numbness. Negative for weakness.  All other systems reviewed and are negative.    Physical Exam Updated Vital Signs BP 130/79 (BP Location: Right Arm)   Pulse 78   Temp 98.1 F (36.7 C) (Oral)   Resp 16   SpO2 97%   Physical Exam  Constitutional: She is oriented to person, place, and time. She appears well-developed and well-nourished. No distress.  HENT:  Head: Normocephalic and atraumatic.  Eyes: Pupils are equal, round, and reactive to light. Conjunctivae are normal. Right eye exhibits no discharge. Left eye exhibits no discharge.  Dizziness elicited with EOMs. No pain with EOMs.   Neck: No JVD present. No tracheal deviation present.  Cardiovascular: Normal rate and regular rhythm.  2+ radial and DP/PT pulses bilaterally, Homans sign  absent bilaterally, no lower extremity edema, no palpable cords, compartments are soft   Pulmonary/Chest: Effort normal and breath sounds normal.  Tenderness to palpation of the anterior chest wall with no deformity, crepitus, ecchymosis, or flail segment  Abdominal: Soft. Bowel sounds are normal. She exhibits no distension. There is no tenderness.  Musculoskeletal: She exhibits no edema.       Right lower leg: Normal. She exhibits no tenderness and no edema.       Left lower leg: Normal. She exhibits no tenderness and no edema.  Neurological: She is alert and oriented to person, place, and time. No cranial nerve deficit.  Mental Status:  Alert, thought content appropriate, able to give a coherent history. Speech fluent without evidence of aphasia. Able to follow 2 step commands without difficulty.  Cranial Nerves:  II:  Peripheral visual fields grossly normal, pupils equal, round, reactive to light III,IV, VI: ptosis not present, extra-ocular motions intact bilaterally  V,VII: smile symmetric, facial light touch sensation equal VIII: hearing grossly normal to voice  X: uvula elevates symmetrically  XI: bilateral shoulder shrug symmetric and strong XII: midline tongue extension without fassiculations Motor:  Normal tone. 5/5 strength of BUE and BLE major muscle groups including strong and equal grip strength and dorsiflexion/plantar flexion Sensory: light touch normal in all extremities. Cerebellar: normal finger-to-nose with bilateral upper extremities Gait: normal gait and balance. Able to walk on toes and heels with ease.   Romberg sign present. No pronator drift. No nystagmus.     Skin: Skin is warm and dry. No erythema.  Psychiatric: She has a normal mood and affect. Her behavior is normal.  Nursing note and vitals reviewed.    ED Treatments / Results  Labs (all labs ordered are listed, but only abnormal results are displayed) Labs Reviewed  CBC - Abnormal; Notable for the  following components:      Result Value   MCH 25.9 (*)    All other components within normal limits  BASIC METABOLIC PANEL - Abnormal; Notable for the following components:   Potassium 3.4 (*)    Glucose, Bld 104 (*)    All other components within normal limits  I-STAT TROPONIN, ED  I-STAT TROPONIN, ED    EKG EKG Interpretation  Date/Time:  Friday June 24 2018 13:16:12 EDT Ventricular Rate:  88 PR Interval:  152 QRS Duration: 84 QT Interval:  414 QTC Calculation: 500  R Axis:   21 Text Interpretation:  Normal sinus rhythm Cannot rule out Anterior infarct , age undetermined Prolonged QT Abnormal ECG Confirmed by Dene Gentry [phone]) on 06/24/2018 5:59:13 PM   Radiology Dg Chest 2 View  Result Date: 06/24/2018 CLINICAL DATA:  52 y/o  F; chest pain. EXAM: CHEST - 2 VIEW COMPARISON:  12/27/2017 chest radiograph. FINDINGS: Stable heart size and mediastinal contours are within normal limits. Both lungs are clear. The visualized skeletal structures are unremarkable. IMPRESSION: No active cardiopulmonary disease. Electronically Signed   By: Kristine Garbe M.D.   On: 06/24/2018 15:46   Ct Head Wo Contrast  Result Date: 06/24/2018 CLINICAL DATA:  Long-term neuro deficit EXAM: CT HEAD WITHOUT CONTRAST TECHNIQUE: Contiguous axial images were obtained from the base of the skull through the vertex without intravenous contrast. COMPARISON:  12/27/2017 FINDINGS: Brain: No evidence of acute infarction, hemorrhage, hydrocephalus, extra-axial collection or mass lesion/mass effect. Vascular: No hyperdense vessel or unexpected calcification. Skull: Normal. Negative for fracture or focal lesion. Sinuses/Orbits: No acute finding. Other: None. IMPRESSION: No acute intracranial abnormality noted. Electronically Signed   By: Inez Catalina M.D.   On: 06/24/2018 19:59    Procedures Procedures (including critical care time)  Medications Ordered in ED Medications  albuterol (PROVENTIL) (2.5  MG/3ML) 0.083% nebulizer solution 5 mg (5 mg Nebulization Given 06/24/18 1430)     Initial Impression / Assessment and Plan / ED Course  I have reviewed the triage vital signs and the nursing notes.  Pertinent labs & imaging results that were available during my care of the patient were reviewed by me and considered in my medical decision making (see chart for details).     Patient with complaints of chest pain reproducible on palpation and dizziness with headache.  She has a history of migraines.  She is afebrile, vital signs are stable.  She is ambulatory without difficulty, no focal neurologic deficits.  Lungs clear to auscultation bilaterally.  Symptoms improved with albuterol nebulizer in triage.  Chest x-ray shows no acute cardiopulmonary abnormalities.  EKG shows normal sinus rhythm no ischemic changes.  Serial troponins are negative.  Symptoms do not appear to be cardiac in etiology and she has a HEART score of 3.  Doubt PE, dissection, cardiac tamponade, pneumothorax, or esophageal rupture.  Remainder of labs reviewed by me overall unremarkable.  Head CT shows no acute intracranial abnormalities.  No evidence of CVA, SAH, ICH, or meningitis.  I offered the patient medication for her headache and dizziness but she declined stating she did not want to interfere with the medications she was already taking.  She states that she feels comfortable overall.  She is ambulatory without difficulty.  Will discharge with meclizine.  Recommend follow-up with PCP or cardiology for further cardiac evaluation on an outpatient basis.  She will also follow-up with her neurologist for reevaluation of her migraines.  Discussed strict ED return precautions. Pt verbalized understanding of and agreement with plan and is safe for discharge home at this time.  She has no complaints prior to discharge.  Final Clinical Impressions(s) / ED Diagnoses   Final diagnoses:  Atypical chest pain  Left arm numbness    Intermittent headache    ED Discharge Orders         Ordered    meclizine (ANTIVERT) 12.5 MG tablet  3 times daily PRN     06/24/18 2120           Rodell Perna A, PA-C 06/25/18 1708  Valarie Merino, MD 06/26/18 [phone]

## 2018-06-24 NOTE — Discharge Instructions (Addendum)
Start taking meclizine up to 3 times daily as needed for dizziness.  This medicine may make you sleepy.  Do not drive if it does.  Take your home medicines as prescribed.  Follow-up with your PCP or cardiology for reevaluation of your chest pains.  Drink plenty of fluids and get plenty of rest.  Follow-up with neurology for reevaluation of your headaches.  Return to the emergency department if any concerning signs or symptoms develop such as worsening chest pains, shortness of breath, vision changes, numbness, weakness, difficulty talking or swallowing.

## 2018-06-24 NOTE — ED Triage Notes (Signed)
Pt reports multiple symptoms of chest pain in the entire chest, numbness down left arm, dizziness, and a moment of LOC. She states she is weak and can't hold her purse or cup. PA at beside. The patient is alert and oriented x4 with no acute distress at triage.

## 2018-06-24 NOTE — ED Notes (Signed)
Patient transported to CT 

## 2018-09-13 ENCOUNTER — Other Ambulatory Visit: Payer: Self-pay | Admitting: Internal Medicine

## 2018-09-16 ENCOUNTER — Other Ambulatory Visit: Payer: Self-pay | Admitting: Internal Medicine

## 2018-09-16 DIAGNOSIS — N644 Mastodynia: Secondary | ICD-10-CM

## 2018-09-21 ENCOUNTER — Other Ambulatory Visit (HOSPITAL_COMMUNITY): Payer: Self-pay | Admitting: Urology

## 2018-09-21 ENCOUNTER — Ambulatory Visit (HOSPITAL_COMMUNITY)
Admission: RE | Admit: 2018-09-21 | Discharge: 2018-09-21 | Disposition: A | Discharge status: Home / Self Care | Payer: Medicare HMO | Source: Ambulatory Visit | Attending: Urology | Admitting: Urology

## 2018-09-21 DIAGNOSIS — C642 Malignant neoplasm of left kidney, except renal pelvis: Secondary | ICD-10-CM | POA: Diagnosis not present

## 2018-09-23 ENCOUNTER — Ambulatory Visit
Admission: RE | Admit: 2018-09-23 | Discharge: 2018-09-23 | Disposition: A | Discharge status: Home / Self Care | Payer: Medicare HMO | Source: Ambulatory Visit | Attending: Internal Medicine | Admitting: Internal Medicine

## 2018-09-23 DIAGNOSIS — N644 Mastodynia: Secondary | ICD-10-CM

## 2019-06-13 ENCOUNTER — Other Ambulatory Visit: Payer: Self-pay | Admitting: Registered"

## 2019-06-13 DIAGNOSIS — Z20822 Contact with and (suspected) exposure to covid-19: Secondary | ICD-10-CM

## 2019-06-14 LAB — NOVEL CORONAVIRUS, NAA: SARS-CoV-2, NAA: NOT DETECTED

## 2019-09-20 ENCOUNTER — Other Ambulatory Visit: Payer: Self-pay | Admitting: Internal Medicine

## 2019-09-20 DIAGNOSIS — N644 Mastodynia: Secondary | ICD-10-CM

## 2019-09-29 ENCOUNTER — Other Ambulatory Visit: Payer: Self-pay

## 2019-09-29 ENCOUNTER — Other Ambulatory Visit: Payer: Self-pay | Admitting: Internal Medicine

## 2019-09-29 ENCOUNTER — Ambulatory Visit
Admission: RE | Admit: 2019-09-29 | Discharge: 2019-09-29 | Disposition: A | Discharge status: Home / Self Care | Payer: Medicare HMO | Source: Ambulatory Visit | Attending: Internal Medicine | Admitting: Internal Medicine

## 2019-09-29 DIAGNOSIS — N6489 Other specified disorders of breast: Secondary | ICD-10-CM

## 2019-09-29 DIAGNOSIS — N644 Mastodynia: Secondary | ICD-10-CM

## 2019-10-04 ENCOUNTER — Other Ambulatory Visit: Payer: Medicare HMO

## 2019-12-04 ENCOUNTER — Ambulatory Visit: Payer: Medicare HMO

## 2020-03-29 ENCOUNTER — Other Ambulatory Visit: Payer: Self-pay | Admitting: Internal Medicine

## 2020-03-29 ENCOUNTER — Other Ambulatory Visit: Payer: Self-pay

## 2020-03-29 ENCOUNTER — Ambulatory Visit
Admission: RE | Admit: 2020-03-29 | Discharge: 2020-03-29 | Disposition: A | Discharge status: Home / Self Care | Payer: Medicare HMO | Source: Ambulatory Visit | Attending: Internal Medicine | Admitting: Internal Medicine

## 2020-03-29 ENCOUNTER — Other Ambulatory Visit: Payer: Medicare HMO

## 2020-03-29 DIAGNOSIS — N6489 Other specified disorders of breast: Secondary | ICD-10-CM

## 2020-10-09 ENCOUNTER — Ambulatory Visit
Admission: RE | Admit: 2020-10-09 | Discharge: 2020-10-09 | Disposition: A | Discharge status: Home / Self Care | Payer: Medicare HMO | Source: Ambulatory Visit | Attending: Internal Medicine | Admitting: Internal Medicine

## 2020-10-09 ENCOUNTER — Other Ambulatory Visit: Payer: Self-pay

## 2020-10-09 DIAGNOSIS — N6489 Other specified disorders of breast: Secondary | ICD-10-CM

## 2021-09-25 ENCOUNTER — Other Ambulatory Visit: Payer: Self-pay | Admitting: Internal Medicine

## 2021-09-25 DIAGNOSIS — Z1231 Encounter for screening mammogram for malignant neoplasm of breast: Secondary | ICD-10-CM

## 2021-10-14 ENCOUNTER — Ambulatory Visit
Admission: RE | Admit: 2021-10-14 | Discharge: 2021-10-14 | Disposition: A | Discharge status: Home / Self Care | Payer: Medicare HMO | Source: Ambulatory Visit | Attending: Internal Medicine | Admitting: Internal Medicine

## 2021-10-14 DIAGNOSIS — Z1231 Encounter for screening mammogram for malignant neoplasm of breast: Secondary | ICD-10-CM

## 2022-09-30 ENCOUNTER — Other Ambulatory Visit: Payer: Self-pay | Admitting: Internal Medicine

## 2022-09-30 DIAGNOSIS — Z1231 Encounter for screening mammogram for malignant neoplasm of breast: Secondary | ICD-10-CM

## 2022-10-16 ENCOUNTER — Ambulatory Visit
Admission: RE | Admit: 2022-10-16 | Discharge: 2022-10-16 | Disposition: A | Discharge status: Home / Self Care | Payer: Medicare HMO | Source: Ambulatory Visit | Attending: Internal Medicine | Admitting: Internal Medicine

## 2022-10-16 DIAGNOSIS — Z1231 Encounter for screening mammogram for malignant neoplasm of breast: Secondary | ICD-10-CM

## 2022-10-21 ENCOUNTER — Other Ambulatory Visit: Payer: Self-pay | Admitting: Internal Medicine

## 2022-10-21 DIAGNOSIS — R928 Other abnormal and inconclusive findings on diagnostic imaging of breast: Secondary | ICD-10-CM

## 2022-11-04 ENCOUNTER — Ambulatory Visit
Admission: RE | Admit: 2022-11-04 | Discharge: 2022-11-04 | Disposition: A | Discharge status: Home / Self Care | Payer: Medicare HMO | Source: Ambulatory Visit | Attending: Internal Medicine | Admitting: Internal Medicine

## 2022-11-04 ENCOUNTER — Ambulatory Visit: Payer: Medicare HMO

## 2022-11-04 DIAGNOSIS — R928 Other abnormal and inconclusive findings on diagnostic imaging of breast: Secondary | ICD-10-CM

## 2022-11-04 HISTORY — DX: Malignant (primary) neoplasm, unspecified: C80.1

## 2023-04-23 ENCOUNTER — Encounter (HOSPITAL_BASED_OUTPATIENT_CLINIC_OR_DEPARTMENT_OTHER): Payer: Self-pay | Admitting: Emergency Medicine

## 2023-04-23 ENCOUNTER — Other Ambulatory Visit: Payer: Self-pay

## 2023-04-23 ENCOUNTER — Emergency Department (HOSPITAL_BASED_OUTPATIENT_CLINIC_OR_DEPARTMENT_OTHER)
Admission: EM | Admit: 2023-04-23 | Discharge: 2023-04-23 | Disposition: A | Discharge status: Home / Self Care | Payer: Medicare HMO | Attending: Emergency Medicine | Admitting: Emergency Medicine

## 2023-04-23 DIAGNOSIS — X58XXXA Exposure to other specified factors, initial encounter: Secondary | ICD-10-CM | POA: Diagnosis not present

## 2023-04-23 DIAGNOSIS — S46912A Strain of unspecified muscle, fascia and tendon at shoulder and upper arm level, left arm, initial encounter: Secondary | ICD-10-CM | POA: Diagnosis not present

## 2023-04-23 DIAGNOSIS — S46812A Strain of other muscles, fascia and tendons at shoulder and upper arm level, left arm, initial encounter: Secondary | ICD-10-CM

## 2023-04-23 DIAGNOSIS — S4992XA Unspecified injury of left shoulder and upper arm, initial encounter: Secondary | ICD-10-CM | POA: Diagnosis present

## 2023-04-23 MED ORDER — METHOCARBAMOL 500 MG PO TABS: 500.0000 mg | ORAL_TABLET | Freq: Two times a day (BID) | ORAL | 0 refills | Status: AC

## 2023-04-23 MED ORDER — METHOCARBAMOL 500 MG PO TABS: 500.0000 mg | ORAL_TABLET | Freq: Two times a day (BID) | ORAL | 0 refills | Status: DC

## 2023-04-23 MED ORDER — ACETAMINOPHEN 500 MG PO TABS
1000.0000 mg | ORAL_TABLET | Freq: Once | ORAL | Status: AC
  Administered 2023-04-23: 1000 mg via ORAL
  Filled 2023-04-23: qty 2

## 2023-04-23 MED ORDER — KETOROLAC TROMETHAMINE 15 MG/ML IJ SOLN
15.0000 mg | Freq: Once | INTRAMUSCULAR | Status: AC
  Administered 2023-04-23: 15 mg via INTRAMUSCULAR
  Filled 2023-04-23: qty 1

## 2023-04-23 NOTE — ED Triage Notes (Signed)
Pt c/o L arm neck pain and L neck pain that began this yesterday. Worsens with movement. Unknown injury

## 2023-04-23 NOTE — ED Provider Notes (Signed)
[place] EMERGENCY DEPARTMENT AT [facility] Provider Note   CSN: [id number] Arrival date & time: 04/23/23  1148     History  Chief Complaint  Patient presents with   Arm Pain    Allison Guerra is a 57 y.o. female.  57 yo F with a cc of left shoulder pain.  She denies injury to the area.  Been going on for the better part of the week.  Tried over-the-counter medications without improvement.  Feels like it radiates to the base of her skull.  Started have some tingling in her left hand as well.   Arm Pain       Home Medications Prior to Admission medications   Medication Sig Start Date End Date Taking? Authorizing Provider  albuterol (PROAIR HFA) 108 (90 BASE) MCG/ACT inhaler Inhale 2 puffs into the lungs every 6 (six) hours as needed for wheezing or shortness of breath.    [provider]  albuterol (PROVENTIL) (2.5 MG/3ML) 0.083% nebulizer solution Take 2.5 mg by nebulization every 6 (six) hours as needed for wheezing or shortness of breath.    [provider]  carbamazepine (TEGRETOL) 200 MG tablet 1 daily for 7 days, followed by 1 tablet twice daily Patient not taking: Reported on 09/13/2017 06/10/17   Doug Sou, MD  cyclobenzaprine (FLEXERIL) 10 MG tablet Take 1 tablet (10 mg total) by mouth 3 (three) times daily as needed for muscle spasms. Patient not taking: Reported on 06/24/2018 12/28/17   Gilda Crease, MD  Dexlansoprazole (DEXILANT) 30 MG capsule Take 30 mg by mouth daily.    [provider]  fluticasone furoate-vilanterol (BREO ELLIPTA) 100-25 MCG/INH AEPB Inhale 1 puff into the lungs daily.     [provider]  ibuprofen (ADVIL,MOTRIN) 800 MG tablet Take 1 tablet (800 mg total) by mouth every 8 (eight) hours as needed for moderate pain. 12/28/17   Gilda Crease, MD  meclizine (ANTIVERT) 12.5 MG tablet Take 1 tablet (12.5 mg total) by mouth 3 (three) times daily as needed for dizziness. 06/24/18    Fawze, Mina A, PA-C  metFORMIN (GLUCOPHAGE) 500 MG tablet Take 1 tablet (500 mg total) by mouth 2 (two) times daily with a meal. 09/14/17   Law, Waylan Boga, PA-C  methocarbamol (ROBAXIN) 500 MG tablet Take 1 tablet (500 mg total) by mouth 2 (two) times daily. 04/23/23   Melene Plan, DO  montelukast (SINGULAIR) 10 MG tablet Take 10 mg by mouth daily. 05/28/17   [provider]  pantoprazole (PROTONIX) 40 MG tablet Take 1 tablet (40 mg total) by mouth 2 (two) times daily. Patient taking differently: Take 40 mg by mouth daily.  05/08/15   Jeralyn Bennett, MD  phentermine (ADIPEX-P) 37.5 MG tablet Take 37.5 mg by mouth daily before breakfast.    [provider]  Respiratory Therapy Supplies (FLUTTER) DEVI Use as directed 05/31/15   Nyoka Cowden, MD  topiramate (TOPAMAX) 50 MG tablet One tablet at night for 2 weeks, then take 2 tablets at night Patient not taking: Reported on 12/28/2017 06/16/17   York Spaniel, MD  traMADol (ULTRAM) 50 MG tablet Take 1 tablet (50 mg total) by mouth every 6 (six) hours as needed. Patient not taking: Reported on 06/24/2018 12/28/17   Gilda Crease, MD  valsartan-hydrochlorothiazide (DIOVAN-HCT) 160-25 MG tablet Take 1 tablet by mouth daily. 12/02/15   [provider]  zolpidem (AMBIEN) 10 MG tablet Take 10 mg by mouth at bedtime. 05/06/18   [provider]      Allergies    Hydromet [hydrocodone bit-homatrop mbr], Reglan [metoclopramide], and Prednisone    Review of Systems   Review of Systems  Physical Exam Updated Vital Signs BP 123/81 (BP Location: Right Arm)   Pulse 79   Temp (!) 97.5 F (36.4 C) (Oral)   Resp 15   SpO2 98%  Physical Exam Vitals and nursing note reviewed.  Constitutional:      General: She is not in acute distress.    Appearance: She is well-developed. She is not diaphoretic.  HENT:     Head: Normocephalic and atraumatic.  Eyes:     Pupils: Pupils are equal, round, and reactive to light.   Cardiovascular:     Rate and Rhythm: Normal rate and regular rhythm.     Heart sounds: No murmur heard.    No friction rub. No gallop.  Pulmonary:     Effort: Pulmonary effort is normal.     Breath sounds: No wheezing or rales.  Abdominal:     General: There is no distension.     Palpations: Abdomen is soft.     Tenderness: There is no abdominal tenderness.  Musculoskeletal:        General: No tenderness.     Cervical back: Normal range of motion and neck supple.     Comments: Trapezius spasm with significant discomfort along the trapezius muscle belly.  Pain at the attachment of the trapezius to the occiput.  Pulse motor and sensation intact to left upper extremity.  No appreciable weakness.  Skin:    General: Skin is warm and dry.  Neurological:     Mental Status: She is alert and oriented to person, place, and time.  Psychiatric:        Behavior: Behavior normal.     ED Results / Procedures / Treatments   Labs (all labs ordered are listed, but only abnormal results are displayed) Labs Reviewed - No data to display  EKG EKG Interpretation Date/Time:  Friday April 23 2023 12:05:04 EDT Ventricular Rate:  80 PR Interval:  173 QRS Duration:  98 QT Interval:  417 QTC Calculation: 482 R Axis:   -6  Text Interpretation: Sinus rhythm No significant change since last tracing Confirmed by Melene Plan [phone]) on 04/23/2023 2:22:35 PM  Radiology No results found.  Procedures Procedures    Medications Ordered in ED Medications  ketorolac (TORADOL) 15 MG/ML injection 15 mg (15 mg Intramuscular Given 04/23/23 1232)  acetaminophen (TYLENOL) tablet 1,000 mg (1,000 mg Oral Given 04/23/23 1230)    ED Course/ Medical Decision Making/ A&P                                 Medical Decision Making Risk OTC drugs. Prescription drug management.   57 yo F with left shoulder pain.  Patient symptoms are reproduced with palpation of the left trapezius.  Will treat supportively.  PCP  follow-up.   2:24 PM:  I have discussed the diagnosis/risks/treatment options with the patient.  Evaluation and diagnostic testing in the emergency department does not suggest an emergent condition requiring admission or immediate intervention beyond what has been performed at this time.  They will follow up with PCP. We also discussed returning to the ED immediately if new or worsening sx occur. We discussed the sx which are most concerning (e.g., sudden worsening pain, fever, inability to tolerate by mouth) that necessitate immediate  return. Medications administered to the patient during their visit and any new prescriptions provided to the patient are listed below.  Medications given during this visit Medications  ketorolac (TORADOL) 15 MG/ML injection 15 mg (15 mg Intramuscular Given 04/23/23 1232)  acetaminophen (TYLENOL) tablet 1,000 mg (1,000 mg Oral Given 04/23/23 1230)     The patient appears reasonably screen and/or stabilized for discharge and I doubt any other medical condition or other [facility] requiring further screening, evaluation, or treatment in the ED at this time prior to discharge.          Final Clinical Impression(s) / ED Diagnoses Final diagnoses:  Trapezius strain, left, initial encounter    Rx / DC Orders ED Discharge Orders          Ordered    methocarbamol (ROBAXIN) 500 MG tablet  2 times daily,   Status:  Discontinued        04/23/23 1224    methocarbamol (ROBAXIN) 500 MG tablet  2 times daily        04/23/23 1242              Melene Plan, DO 04/23/23 1424

## 2023-04-23 NOTE — Discharge Instructions (Addendum)
I think you strained a muscle in your back.  Unfortunately this muscle is hard to rehab from.  Please follow-up with your family doctor in the office if you continue to have symptoms they may want to have you go through physical therapy.  I have given you a sling for comfort.  You do need to take your arm out of the sling at least 4 times a day and move your arm around.  Take 4 over the counter ibuprofen tablets 3 times a day or 2 over-the-counter naproxen tablets twice a day for pain. Also take tylenol 1000mg (2 extra strength) four times a day.

## 2023-04-23 NOTE — ED Notes (Signed)
Pt discharged to home using teachback Method. Discharge instructions have been discussed with patient and/or family members. Pt verbally acknowledges understanding d/c instructions, has been given opportunity for questions to be answered, and endorses comprehension to checkout at registration before leaving.  

## 2023-12-17 ENCOUNTER — Other Ambulatory Visit: Payer: Self-pay | Admitting: Family Medicine

## 2023-12-17 DIAGNOSIS — Z1231 Encounter for screening mammogram for malignant neoplasm of breast: Secondary | ICD-10-CM

## 2023-12-30 ENCOUNTER — Encounter (HOSPITAL_COMMUNITY): Payer: Self-pay

## 2024-01-06 ENCOUNTER — Ambulatory Visit

## 2024-01-07 ENCOUNTER — Ambulatory Visit

## 2024-01-11 ENCOUNTER — Ambulatory Visit
Admission: RE | Admit: 2024-01-11 | Discharge: 2024-01-11 | Disposition: A | Discharge status: Home / Self Care | Source: Ambulatory Visit | Attending: Family Medicine | Admitting: Family Medicine

## 2024-01-11 DIAGNOSIS — Z1231 Encounter for screening mammogram for malignant neoplasm of breast: Secondary | ICD-10-CM
